# Patient Record
Sex: Female | Born: 1955 | Race: White | Hispanic: No | Marital: Married | Smoking: Never smoker
Health system: Southern US, Community
[De-identification: ages and names within clinical notes are randomized; demographics above are authoritative.]

## PROBLEM LIST (undated history)

## (undated) DIAGNOSIS — R509 Fever, unspecified: Secondary | ICD-10-CM

## (undated) DIAGNOSIS — M341 CR(E)ST syndrome: Principal | ICD-10-CM

## (undated) DIAGNOSIS — I7300 Raynaud's syndrome without gangrene: Secondary | ICD-10-CM

## (undated) DIAGNOSIS — N951 Menopausal and female climacteric states: Secondary | ICD-10-CM

## (undated) DIAGNOSIS — M1990 Unspecified osteoarthritis, unspecified site: Secondary | ICD-10-CM

## (undated) HISTORY — PX: TONSILLECTOMY: SUR1361

## (undated) HISTORY — PX: BREAST CYST EXCISION: SHX579

## (undated) HISTORY — PX: BREAST EXCISIONAL BIOPSY: SUR124

## (undated) HISTORY — PX: KNEE ARTHROSCOPY: SUR90

---

## 2009-06-06 NOTE — Telephone Encounter (Signed)
Medication is correct.  Patient was started on medication 12/11/08 (last seen on 12/10/08).  No future appointment is scheduled with you to be seen.  Did you want patient to continue medication?

## 2009-06-07 NOTE — Telephone Encounter (Signed)
Per Dr. Marshell Levan, patient advised that she is due for follow up appointment.  Appointment scheduled with Dr. Marshell Levan for 06/27/09.

## 2009-06-27 NOTE — Progress Notes (Signed)
Subjective:      Patient ID: Stefanie Marshall is a 53 y.o. female.    HPI Comments: Vitamin D level f/u: Patient nails are still very brittle and so are her teeth. Also any bruises, scratches seem to take a long time to heal. Patient is also having indigestion problems after eating she gets a burning feeling in her chest that goes around to her back. Also if so does not eat Fiber one for breakfast she can not have a BM. Patient has lump under her left arm pit, she has appointment with [facility] outpatient imaging  07/27/09  heartburn 2 x / week  x 1 year , more recently had increase in symptoms including upper dysphagia with breads and meats . Has tried AAOC with mild improvement . NSAID's for 2-3 days , couple times / month for tension headache .      Review of Systems   Constitutional: Negative.    HENT: Negative.    Respiratory: Negative.    Cardiovascular: Negative.    Gastrointestinal: Negative.         See HPI    Genitourinary: Negative.    Musculoskeletal: Negative.    Neurological: Negative.    Psychiatric/Behavioral: Negative.    All other systems reviewed and are negative.        Objective:   Physical Exam   Nursing note and vitals reviewed.  Constitutional: She is oriented to person, place, and time. She appears well-developed and well-nourished. No distress.   HENT:        small chip off the upper incisor    Cardiovascular: Normal rate, regular rhythm, normal heart sounds and intact distal pulses.   No extrasystoles are present. Exam reveals no gallop and no friction rub.    No murmur heard.  Pulses:       Dorsalis pedis pulses are 2+ on the right side, and 2+ on the left side.   Pulmonary/Chest: Effort normal and breath sounds normal. No respiratory distress. She has no decreased breath sounds. She has no wheezes. She has no rales. She exhibits no tenderness.   Abdominal: Soft. Bowel sounds are normal. She exhibits no distension and no mass. There is no organomegaly, splenomegaly or hepatomegaly. No  tenderness. She has no rebound and no guarding.   Musculoskeletal: She exhibits no edema and no tenderness.   Lymphadenopathy:     She has axillary adenopathy.        Left axillary: Pectoral adenopathy present.        single tender 1x 1.5 cm node or cyst, freely mobile    Neurological: She is alert and oriented to person, place, and time.   Skin: She is not diaphoretic.        few vertical cracks in the nails    Psychiatric: She has a normal mood and affect. Her behavior is normal. Judgment and thought content normal.       ASSESSMENT and PLAN ;     1.  Unspecified Vitamin D Deficiency (268.9) labs today  since on replacement   Vitamin D (ERGOCALCIFEROL) 50000 UNIT capsule, VITAMIN D 25 HYDROXY    2.  Other Specified Disease of Nail (703.8) if not better and labs are ok then will refer to Dr. Eddie Dibbles   ZINC    3.  Irritable Bowel Syndrome (564.1) stable       4.  Dysphagia (787.20S) EGD with Dr. June Leap  to evaluate for swallowing problems.  Non-pharm measures including avoidance of caffine, smoking,  NSAIDS, chocolates, acidic foods such as tomato sauces, orange juice , grapefruit juice, and eating within 2 hours of bedtime discussed with the patient.   CBC WITH AUTO DIFFERENTIAL, COMPREHENSIVE METABOLIC PANEL    5.  GERD (Gastroesophageal Reflux Disease) (530.81S)EGD with Dr. June Leap  to evaluate for swallowing problems.  Non-pharm measures including avoidance of caffine, smoking, NSAIDS, chocolates, acidic foods such as tomato sauces, orange juice , grapefruit juice, and eating within 2 hours of bedtime discussed with the patient.   omeprazole 20 mg bid x couple months pending EGD and response to treatment     CBC WITH AUTO DIFFERENTIAL, COMPREHENSIVE METABOLIC PANEL        [name], MD

## 2009-06-27 NOTE — Patient Instructions (Addendum)
EGD with Dr. June Leap  to evaluate for swallowing problems.  Non-pharm measures including avoidance of caffine, smoking, NSAIDS, chocolates, acidic foods such as tomato sauces, orange juice , grapefruit juice, and eating within 2 hours of bedtime discussed with the patient.   Dr. Fortunato Curling

## 2009-06-28 LAB — COMPREHENSIVE METABOLIC PANEL
ALT: 15 U/L (ref 10–40)
AST: 19 U/L (ref 15–37)
Albumin/Globulin Ratio: 1.7 (ref 1.1–2.2)
Albumin: 4.1 g/dL (ref 3.4–5.0)
Alkaline Phosphatase: 40 U/L — ABNORMAL LOW
BUN: 19 mg/dL — ABNORMAL HIGH (ref 7–18)
CO2: 28 meq/L (ref 21–32)
Calcium: 9.7 mg/dL (ref 8.3–10.6)
Chloride: 104 meq/L (ref 99–110)
Creatinine: 0.9 mg/dL (ref 0.6–1.1)
GFR Est, African/Amer: >60
GFR, Estimated: >60 (ref >60)
Glucose: 97 mg/dL (ref 70–99)
Potassium: 4 meq/L (ref 3.5–5.1)
Sodium: 140 meq/L (ref 136–145)
Total Bilirubin: 0.4 mg/dL (ref 0.0–1.0)
Total Protein: 6.4 g/dL (ref 6.4–8.2)

## 2009-06-28 LAB — CBC WITH AUTO DIFFERENTIAL
Basophils %: 0.3 % (ref 0.0–2.0)
Basophils Absolute: 0 10*3 (ref 0.0–0.2)
Eosinophils %: 0.9 % (ref 0.0–5.0)
Eosinophils Absolute: 0.1 10*3 (ref 0.0–0.6)
Granulocyte Absolute Count: 5 10*3 (ref 1.7–7.7)
Hematocrit: 39.6 % (ref 36.0–48.0)
Hemoglobin: 13.5 g/dL (ref 12.0–16.0)
Lymphocytes %: 24.2 % — ABNORMAL LOW (ref 25.0–40.0)
Lymphocytes Absolute: 1.8 10*3 (ref 1.0–5.1)
MCH: 31.6 pg (ref 26–34)
MCHC: 34.1 g/dL (ref 31–36)
MCV: 92.6 fl (ref 80–100)
MPV: 8 fl (ref 5.0–10.5)
Monocytes %: 6.1 % (ref 0.0–8.0)
Monocytes Absolute: 0.4 10*3 (ref 0.0–0.95)
Platelets: 314 10*3 (ref 135–450)
RBC: 4.27 10*6 (ref 4.0–5.2)
RDW: 13.4 % (ref 11.5–14.5)
Segs Relative: 68.5 % — ABNORMAL HIGH (ref 42.0–63.0)
WBC: 7.3 10*3 (ref 4.0–11.0)

## 2009-06-29 LAB — VITAMIN D 25 HYDROXY: Vit D, 25-Hydroxy: 58 ng/mL (ref 30–80)

## 2009-07-01 NOTE — Telephone Encounter (Signed)
Patient would her lab results from 06/27/09

## 2009-07-01 NOTE — Telephone Encounter (Signed)
see lab note

## 2009-12-16 NOTE — Telephone Encounter (Signed)
Patient advised and rx phoned to pharmacy.

## 2009-12-16 NOTE — Telephone Encounter (Signed)
Patient c/o sore throat, laryngitis,sinus drainage, productive cough of yellow and clear phlegm. No fever. Sxs began last Thursday. Taking Tylenol and generic sinus medication.

## 2009-12-16 NOTE — Telephone Encounter (Signed)
Amoxicillin 500 mg tid x 10 days ,  follow up if  symptoms increase or persist.

## 2009-12-21 NOTE — Progress Notes (Signed)
Subjective:      Patient ID: Stefanie Marshall is a 54 y.o. female.    HPI Comments: complains of persistent cough and laryngitis , right ear pain and pressure . Has been on Amoxicillin 500 mg tid x 10 days , no other over the counter       Review of Systems   Respiratory: Positive for cough.        Objective:   Physical Exam   Nursing note and vitals reviewed.  Constitutional: She is oriented to person, place, and time. She appears well-developed and well-nourished. No distress.   HENT:   Head: Normocephalic and atraumatic.   Right Ear: External ear normal. No drainage. Tympanic membrane is injected and bulging. No decreased hearing is noted.   Left Ear: External ear normal. No drainage. Tympanic membrane is injected and bulging. No decreased hearing is noted.   Nose: Mucosal edema and rhinorrhea present. Right sinus exhibits maxillary sinus tenderness. Right sinus exhibits no frontal sinus tenderness. Left sinus exhibits maxillary sinus tenderness. Left sinus exhibits no frontal sinus tenderness.   Mouth/Throat: Oropharynx is clear and moist. No oropharyngeal exudate, posterior oropharyngeal edema, posterior oropharyngeal erythema or tonsillar abscesses.   Eyes: Right eye exhibits no discharge and no exudate. Left eye exhibits no discharge and no exudate. Right conjunctiva is not injected. Right conjunctiva has no hemorrhage. Left conjunctiva is not injected. Left conjunctiva has no hemorrhage.   Neck: Normal range of motion. Neck supple.   Cardiovascular: Normal rate, regular rhythm, normal heart sounds and intact distal pulses.    No murmur heard.  Pulmonary/Chest: Effort normal and breath sounds normal. No respiratory distress. She has no wheezes. She has no rales. She exhibits no tenderness.   Abdominal: No tenderness.   Musculoskeletal: She exhibits no edema.   Lymphadenopathy:        Head (right side): No submental, no submandibular, no tonsillar and no preauricular adenopathy present.        Head (left side): No  submental, no submandibular, no tonsillar and no preauricular adenopathy present.     She has no cervical adenopathy.   Neurological: She is alert and oriented to person, place, and time.   Skin: She is not diaphoretic.      ASSESSMENT and PLAN ;     1.  Acute maxillary sinusitis (461.0) partially treated , complete Amoxicillin 500 mg tid x 10 days and sudafed till the pressure resolves   if the sinus pain persist then change to avelox for 10 days           [name], MD

## 2009-12-21 NOTE — Patient Instructions (Signed)
sudafed till the pressure resolves   if the sinus pain persist then change to avelox for 10 days

## 2010-01-12 NOTE — Progress Notes (Signed)
Subjective:      Patient ID: Stefanie Marshall is a 54 y.o. female.    HPI Comments: follow up -  patient thinks she might have fluid in her ears , rest of the sinusitis has resolved   still with some upper esophageal dysphagia which did improve for awhile , no reflux but has epigastric pressure , has improved the diet   nails still very sensitive , brittle with ridges , no other skin problems , no improvement with the vitamin - D       Review of Systems   All other systems reviewed and are negative.        Objective:   Physical Exam   Nursing note and vitals reviewed.  Constitutional: She appears well-developed and well-nourished. No distress.   HENT:        mild bilateral SOM following recent URI    Abdominal: Soft. Bowel sounds are normal. She exhibits no distension and no mass. No tenderness. She has no rebound and no guarding.   Skin: She is not diaphoretic.        brittle nails with linear ridges        ASSESSMENT and PLAN ;     1.  Unspecified vitamin D deficiency (268.9) will continue with MVI and can stop the vitamin - D supplements     2.  Urge incontinence (788.31)     3.  Other specified disease of nail (703.8) will follow up  with Dr. Rexford Maus     4.  Irritable bowel syndrome (564.1)  Controlled , continue current treatment      5.  GERD (gastroesophageal reflux disease) (530.81S) Non-pharm measures including avoidance of caffine, smoking, NSAIDS, chocolates, acidic foods such as tomato sauces, orange juice , grapefruit juice, and eating within 2 hours of bedtime discussed with the patient. , continue prilosec and follow up  Dr. June Leap if dysphagia progresses     6.  Brittle nails (703.8J) see above         [name], MD

## 2010-07-06 NOTE — Progress Notes (Signed)
Subjective:      Patient ID: Stefanie Marshall is a 54 y.o. female.    HPI Comments: Patient presents with:    Sinus Problem - follow up - patient having problems with neck pain x several weeks on the right , no trauma  and rhinorrhea which is tolerable , nose always feels dry .  Patient having pain in upper right thigh over the trochanter .       Patient Active Problem List    GERD (gastroesophageal reflux disease) - overall controlled ,  dysphagia to breads  but better than before dillitation , has cut back on caffeine          Priority: High (1)         Date Noted: 08/04/2009         Overview Note:            barium swallow 07/28/2009 - small HH with mild            reflux , dilatation 8 /2010      Allergic rhinitis, cause unspecified         Priority: High (1)    Urge incontinence         Priority: Medium (2)    Symptomatic menopausal or female climacteric states         Priority: Medium (2)    Unspecified backache         Priority: Medium (2)    Benign neoplasm of breast         Priority: Medium (2)    Irritable bowel syndrome         Priority: Medium (2)    Preventative health care         Priority: Low (3)         Date Noted: 04/01/2010         Overview Note:            mammogram 03/30/2010 within normal limits             Patient declined tdap a this time.  Will check            with insurance and call if             wants shot      Rosacea         Priority: Low (3)    HEADACHE         Priority: Low (3)      allergies and intolerances    -- Detrol (Tolterodine Tartrate)     --  headache   -- Erythromycin Ethylsuccinate     --  Morning sickness   -- Iodine      Current outpatient prescriptions:omeprazole (PRILOSEC) 20 MG capsule, Take 20 mg by mouth daily.  , Disp: , Rfl: ;  estrogens conjugated, synthetic A, (CENESTIN) 0.625 MG tablet, Take 0.625 mg by mouth daily., Disp: , Rfl: ;  progesterone (PROMETRIUM) 200 MG capsule, Take 200 mg by mouth. First 12 days of month, Disp: , Rfl: ;  Multiple Vitamins-Minerals  (CENTRUM PO), Take  by mouth daily., Disp: , Rfl:      ----------------------------                   07/06/10                          0813           ----------------------------  BP:            100/64           Pulse:           64             Weight:  124 lb (56.246 kg)    ----------------------------        Review of Systems   HENT: Positive for rhinorrhea.    Eyes:        Dry eye    Musculoskeletal: Positive for arthralgias.   All other systems reviewed and are negative.        Objective:   Physical Exam   Nursing note and vitals reviewed.  Constitutional: She is oriented to person, place, and time. She appears well-developed and well-nourished. No distress.   HENT:        allergic rhinitis    Neck: Muscular tenderness present.        spasm and tenderness over the trapezius muscle on the right       Cardiovascular: Normal rate, regular rhythm, normal heart sounds and intact distal pulses.   No extrasystoles are present. Exam reveals no gallop and no friction rub.    No murmur heard.  Pulses:       Dorsalis pedis pulses are 2+ on the right side, and 2+ on the left side.   Pulmonary/Chest: Effort normal and breath sounds normal. No respiratory distress. She has no decreased breath sounds. She has no wheezes. She has no rales. She exhibits no tenderness.   Abdominal: Soft. Bowel sounds are normal. She exhibits no distension and no mass. There is no hepatosplenomegaly, splenomegaly or hepatomegaly. Tenderness is present in the epigastric area. She has no rigidity, no rebound, no guarding and no CVA tenderness.   Musculoskeletal: She exhibits no edema and no tenderness.        tenderness over the lateral right thigh    Neurological: She is alert and oriented to person, place, and time.   Skin: She is not diaphoretic.   Psychiatric: She has a normal mood and affect. Her behavior is normal. Judgment and thought content normal.      ASSESSMENT and PLAN ;     1.  GERD (gastroesophageal reflux disease) - see below      2.  Neck pain - see below     3.  Right thigh pain - strain of the vastus muscle , stretching as demonstrated     4.  Preventative health care         Patient Instructions    Non-pharm measures including avoidance of caffine, smoking, NSAIDS, chocolates, acidic foods such as tomato sauces, orange juice , grapefruit juice, and eating within 2 hours of bedtime discussed with the patient.   increase the omeprazole  if the swallowing increases  ice and stretching for the right thigh as discussed   sleep position and ergonomics as discussed for the neck , massage and ice as needed     if the nasonex is not working then call and we can change to atrovent        No results found for this visit on 07/06/10.     [name], MD    No orders of the defined types were placed in this encounter.

## 2010-07-06 NOTE — Patient Instructions (Addendum)
Non-pharm measures including avoidance of caffine, smoking, NSAIDS, chocolates, acidic foods such as tomato sauces, orange juice , grapefruit juice, and eating within 2 hours of bedtime discussed with the patient.   increase the omeprazole  if the swallowing increases  ice and stretching for the right thigh as discussed   sleep position and ergonomics as discussed for the neck , massage and ice as needed     if the nasonex is not working then call and we can change to atrovent

## 2011-04-24 NOTE — Telephone Encounter (Signed)
Patient jammed her left ring finger into the floor on Monday and says she thinks it may be broken. She did not go to the ER or Urgent Care but would like to know if there are any signs she should watch for. She says it's bruised and very swollen. She says there is slight tingling and numbness. She just wants to know if there are any signs that the blood supply might be cut off etc.

## 2011-04-25 NOTE — Telephone Encounter (Signed)
lmtcb

## 2011-04-25 NOTE — Telephone Encounter (Signed)
Patient scheduled today.

## 2011-04-25 NOTE — Progress Notes (Signed)
Subjective:      Patient ID: Stefanie Marshall is a 55 y.o. female.    HPI Comments: Patient presents with:    Hand Pain - "Jammed"left ring finger on Monday on wood floor-finger swollen, discolored and painful        allergies and intolerances    -- Detrol (Tolterodine Tartrate)     --  headache   -- Erythromycin Ethylsuccinate     --  Morning sickness   -- Iodine     Current outpatient prescriptions:omeprazole (PRILOSEC) 20 MG capsule, TAKE ONE CAPSULE BY MOUTH TWICE A DAY, Disp: 60 capsule, Rfl: 5;  mometasone (NASONEX) 50 MCG/ACT nasal spray, 2 sprays by Nasal route daily., Disp: 1 Inhaler, Rfl: 5;  estrogens conjugated, synthetic A, (CENESTIN) 0.625 MG tablet, Take 0.625 mg by mouth daily., Disp: , Rfl: ;  progesterone (PROMETRIUM) 200 MG capsule, Take 200 mg by mouth. First 12 days of month, Disp: , Rfl:   Multiple Vitamins-Minerals (CENTRUM PO), Take  by mouth daily., Disp: , Rfl:      ----------------------------                   04/25/11                          1233           ----------------------------     BP:            102/62           Pulse:           72             Resp:            20             Weight:  124 lb (56.246 kg)    ----------------------------      Hand Pain         Review of Systems    Objective:   Physical Exam   Musculoskeletal:        mild tenderness and faint bruising without swelling of the left ring finger DIP , full range of motion       ASSESSMENT and PLAN ;     1.  Finger injury mild contusion , ice and elevation as needed , no sign of fracture         There are no Patient Instructions on file for this visit.     No results found for this visit on 04/25/11.     [name], MD    No orders of the defined types were placed in this encounter.

## 2011-04-25 NOTE — Telephone Encounter (Signed)
should be seen , either by me or Dr. Anne Hahn , please take care of this .

## 2011-09-10 NOTE — Telephone Encounter (Signed)
Medication in on patients history list.  Patient last seen 04/25/2011

## 2011-09-11 NOTE — Telephone Encounter (Signed)
Prior Authorization approved for Nasonex, pharmacy advised. PA good through 09/10/2012

## 2011-10-07 NOTE — Progress Notes (Signed)
Subjective:      Patient ID: Stefanie Marshall is a 55 y.o. female.    HPI Comments: Patient presents with:    Hand Pain - Left ring finger dirt under nail  for two weeks.        allergies and intolerances    -- Detrol (Tolterodine Tartrate)     --  headache   -- Erythromycin Ethylsuccinate     --  Morning sickness   -- Iodine     Current outpatient prescriptions:NASONEX 50 MCG/ACT nasal spray, USE 2 SPRAYS IN EACH NOSTRIL DAILY, Disp: 17 g, Rfl: 1;  omeprazole (PRILOSEC) 20 MG capsule, TAKE ONE CAPSULE BY MOUTH TWICE A DAY, Disp: 60 capsule, Rfl: 5;  estrogens conjugated, synthetic A, (CENESTIN) 0.625 MG tablet, Take 0.625 mg by mouth daily., Disp: , Rfl: ;  progesterone (PROMETRIUM) 200 MG capsule, Take 200 mg by mouth. First 12 days of month, Disp: , Rfl:   Multiple Vitamins-Minerals (CENTRUM PO), Take  by mouth daily., Disp: , Rfl:     ----------------------------                09/11/11                       1143         ----------------------------   BP:           122/76         Pulse:          76           Temp:   98.2 F (36.8 C)    TempSrc:       Oral          Resp:           18           Weight: 126 lb (57.153 kg)  ----------------------------     Hand Pain         Review of Systems    Objective:   Physical Exam   Nursing note and vitals reviewed.  Skin:        benign nail other than dirt , no splinter or foreign body       ASSESSMENT and PLAN ;     1.  Finger pain I can't believe someone would come to the office for dirt under their nail        There are no Patient Instructions on file for this visit.     No results found for this visit on 09/11/11.     [name], MD    No orders of the defined types were placed in this encounter.

## 2011-10-29 NOTE — Progress Notes (Signed)
Subjective:      Patient ID: Stefanie Marshall is a 55 y.o. female.    HPI Comments: Patient presents with:    Fever - Patient has had a fever, dry cough, pain in eyebrows, pain in middle back, achy, and this started 9 days ago. She has had trouble sleeping due to the coughing. currently taking mucinex DM AND tylenol         allergies and intolerances    -- Detrol (Tolterodine Tartrate)     --  headache   -- Erythromycin Ethylsuccinate     --  Morning sickness   -- Iodine     Current outpatient prescriptions:Dextromethorphan-Guaifenesin (MUCINEX DM) 30-600 MG TB12, Take  by mouth daily as needed.  , Disp: , Rfl: ;  acetaminophen (TYLENOL) 500 MG tablet, Take 500 mg by mouth every 4 hours as needed.  , Disp: , Rfl: ;  NASONEX 50 MCG/ACT nasal spray, USE 2 SPRAYS IN EACH NOSTRIL DAILY, Disp: 17 g, Rfl: 1;  omeprazole (PRILOSEC) 20 MG capsule, TAKE ONE CAPSULE BY MOUTH TWICE A DAY, Disp: 60 capsule, Rfl: 5  estrogens conjugated, synthetic A, (CENESTIN) 0.625 MG tablet, Take 0.625 mg by mouth daily., Disp: , Rfl: ;  progesterone (PROMETRIUM) 200 MG capsule, Take 200 mg by mouth. First 12 days of month, Disp: , Rfl: ;  Multiple Vitamins-Minerals (CENTRUM PO), Take  by mouth daily., Disp: , Rfl:     ----------------------------                10/29/11                       0942         ----------------------------   BP:           124/72         Pulse:          72           Temp:   99.3 F (37.4 C)    Weight: 126 lb (57.153 kg)  ----------------------------     Fever   Associated symptoms include congestion, coughing, headaches and a sore throat. Pertinent negatives include no wheezing.       Review of Systems   Constitutional: Positive for fever and chills.   HENT: Positive for congestion, sore throat and sinus pressure.    Respiratory: Positive for cough. Negative for shortness of breath and wheezing.    Neurological: Positive for headaches.       Objective:   Physical Exam   [nursing  notereviewed.  Constitutional: She is oriented to person, place, and time. She appears well-developed and well-nourished. No distress.   HENT:   Head: Normocephalic and atraumatic.   Right Ear: External ear normal.   Left Ear: External ear normal.   Nose: Nose normal. Right sinus exhibits no maxillary sinus tenderness and no frontal sinus tenderness. Left sinus exhibits no maxillary sinus tenderness and no frontal sinus tenderness.   Mouth/Throat: Oropharynx is clear and moist. No oropharyngeal exudate.   Eyes: Right eye exhibits no discharge and no exudate. Left eye exhibits no discharge and no exudate. Right conjunctiva is not injected. Right conjunctiva has no hemorrhage. Left conjunctiva is not injected. Left conjunctiva has no hemorrhage.   Neck: Normal range of motion. Neck supple.   Cardiovascular: Normal rate, regular rhythm, normal heart sounds and intact distal pulses.    No murmur heard.  Pulmonary/Chest: Effort normal and breath sounds normal. No respiratory  distress. She has no wheezes. She has no rales. She exhibits no tenderness.   Abdominal: There is no tenderness.   Musculoskeletal: She exhibits no edema.   Lymphadenopathy:     She has no cervical adenopathy.   Neurological: She is alert and oriented to person, place, and time.   Skin: She is not diaphoretic.        ASSESSMENT and PLAN ;     1.  Acute upper respiratory infections of unspecified site - appears viral but with duration need to consider bacterial infection , see below      2.  GERD (gastroesophageal reflux disease) - prilosec daily over the next few days         All above medical conditions have been reviewed and assessed to be stable therefore will continue current treatment with the exception of that stated below .    Patient Instructions    increase fluids to 8-10 12oz / day   tylenol and if no improvement by tomorrow then start the ceftin for sinusitis        No results found for this visit on 10/29/11.     [name], MD    No  orders of the defined types were placed in this encounter.

## 2011-10-29 NOTE — Patient Instructions (Signed)
increase fluids to 8-10 12oz / day   tylenol and if no improvement by tomorrow then start the ceftin for sinusitis

## 2011-10-31 NOTE — Telephone Encounter (Signed)
Per Dr.Nartker patient advised to fill the antibiotic that was given at office visit and to call if not feeling better.

## 2011-10-31 NOTE — Telephone Encounter (Signed)
Patient called stating that she is now going on 12 days of running fevers. Her fever has been staying around the 102 point. She had been taking Tylenol every 4 hours to help keep it down, but she states that this is no longer helping. Patient still has the dry cough and has been taking the 1 teaspoon of the cough syrup given on 10-29-11 but it doesn't seem to be touching the cough. Patient is also still achy and states that she isn't any worse but isn't any better either. She has been trying to drink as much as she can, she is drinking about 8 glasses of fluids a day is all. Patient would like to know what else Dr.Nartker would recommend.    Patient [phone]  [facility] [phone]

## 2011-11-02 ENCOUNTER — Telehealth

## 2011-11-02 MED ORDER — HYDROCOD POLST-CPM POLST ER 10-8 MG/5ML PO SUER
10-8 MG/5ML | Freq: Two times a day (BID) | ORAL | Status: DC | PRN
Start: 2011-11-02 — End: 2011-11-06

## 2011-11-02 MED ORDER — MOXIFLOXACIN HCL 400 MG PO TABS
400 MG | ORAL_TABLET | Freq: Every day | ORAL | Status: AC
Start: 2011-11-02 — End: 2011-11-12

## 2011-11-02 NOTE — Telephone Encounter (Signed)
Still running a fever at 102. Coughing, would like to know if she can get a stronger cough medicine. The cough is not getting any better. Is there anything else she can do to reduce the fever.

## 2011-11-02 NOTE — Telephone Encounter (Signed)
Patient advised and rx faxed to pharmacy

## 2011-11-03 LAB — CBC WITH AUTO DIFFERENTIAL
Basophils %: 0.3 % (ref 0.0–2.0)
Basophils Absolute: 0 10*3 (ref 0.0–0.2)
Eosinophils %: 0.1 % (ref 0.0–5.0)
Eosinophils Absolute: 0 10*3 (ref 0.0–0.6)
Granulocyte Absolute Count: 10 10*3 — ABNORMAL HIGH (ref 1.7–7.7)
Hematocrit: 37.9 % (ref 36.0–48.0)
Hemoglobin: 12.9 gm/dl (ref 12.0–16.0)
Lymphocytes %: 9.9 % — ABNORMAL LOW (ref 25.0–40.0)
Lymphocytes Absolute: 1.2 10*3 (ref 1.0–5.1)
MCH: 31 pg (ref 26–34)
MCHC: 34 gm/dl (ref 31–36)
MCV: 91.1 fl (ref 80–100)
MPV: 7.8 fl (ref 5.0–10.5)
Monocytes %: 3.8 % (ref 0.0–12.0)
Monocytes Absolute: 0.4 10*3 (ref 0.0–1.3)
Platelets: 513 10*3 — ABNORMAL HIGH (ref 135–450)
RBC: 4.16 10*6 (ref 4.0–5.2)
RDW: 13.2 % (ref 11.5–14.5)
Segs Relative: 85.9 % — ABNORMAL HIGH (ref 42.0–63.0)
WBC: 11.7 10*3 — ABNORMAL HIGH (ref 4.0–11.0)

## 2011-11-03 LAB — COMPREHENSIVE METABOLIC PANEL
ALT: 20 U/L (ref 10–40)
AST: 25 U/L (ref 15–37)
Albumin/Globulin Ratio: 1.6 (ref 1.1–2.2)
Albumin: 3.8 gm/dl (ref 3.4–5.0)
Alkaline Phosphatase: 91 U/L (ref 45–129)
BUN: 12 mg/dl (ref 7–18)
CO2: 31 mEq/L (ref 21–32)
Calcium: 9.2 mg/dl (ref 8.3–10.6)
Chloride: 102 mEq/L (ref 99–110)
Creatinine: 0.7 mg/dl (ref 0.6–1.1)
GFR Est, African/Amer: >60
GFR, Estimated: >60 (ref >60)
Glucose: 121 mg/dl — ABNORMAL HIGH (ref 70–99)
Potassium: 4.4 mEq/L (ref 3.5–5.1)
Sodium: 142 mEq/L (ref 136–145)
Total Bilirubin: 0.3 mg/dl (ref 0.0–1.0)
Total Protein: 6.2 gm/dl — ABNORMAL LOW (ref 6.4–8.2)

## 2011-11-05 NOTE — Telephone Encounter (Signed)
WBC up with current right pneumonia

## 2011-11-06 MED ORDER — HYDROCOD POLST-CPM POLST ER 10-8 MG/5ML PO SUER
10-8 MG/5ML | Freq: Two times a day (BID) | ORAL | Status: AC | PRN
Start: 2011-11-06 — End: 2012-11-05

## 2011-11-06 NOTE — Telephone Encounter (Signed)
Rx called to pharmacy with directions

## 2011-11-06 NOTE — Telephone Encounter (Signed)
Patient is requesting a refill for this cough medication after four days.  Pharmacy said this should have lasted for 12 days not 4.  Patient said she is taking it every 12 hours but is taking a tablespoon each time.  Patient told pharmacy if you did not want to refill this early she would need something else in its place.

## 2011-11-07 NOTE — Telephone Encounter (Signed)
ok , mild elevation of WBC from pneumonia

## 2011-11-07 NOTE — Telephone Encounter (Signed)
lmtcb at work for patient.

## 2011-11-07 NOTE — Telephone Encounter (Signed)
complete the antibiotics , should be feeling better by now , back to normal in 2-3 weeks

## 2011-11-07 NOTE — Telephone Encounter (Signed)
Patient advised.   Patient would like to know how much longer she can expect to have this pneumonia.

## 2011-11-07 NOTE — Telephone Encounter (Signed)
Patient requesting results of recent lab work that was done.

## 2011-11-07 NOTE — Telephone Encounter (Signed)
Patient advised

## 2011-11-08 NOTE — Telephone Encounter (Signed)
Patient states she has developed aching of her legs while standing, especially from the knees down.  She spoke with the pharmacist who advised her this could be a side effect of the Avelox.  She is feeling better but the cough remains about the same.    Pharmacy # [phone]

## 2011-11-08 NOTE — Telephone Encounter (Signed)
increase fluids, complete the avelox

## 2011-11-08 NOTE — Telephone Encounter (Signed)
Patient advised.

## 2011-11-09 NOTE — Telephone Encounter (Signed)
Patient called requesting a work note. Patient would like a note stating that she was off work 10/29/11-11/02/11 and she will be working half days 11/05/11-11/09/11 and returning to work full time on 11/12/11.     Please fax the note to [phone] to attention [place]    Patient [phone]

## 2011-11-09 NOTE — Telephone Encounter (Signed)
Ready

## 2011-11-09 NOTE — Telephone Encounter (Signed)
Work note faxed.  Patient advised.

## 2011-12-20 MED ORDER — CEFUROXIME AXETIL 250 MG PO TABS
250 MG | ORAL_TABLET | Freq: Two times a day (BID) | ORAL | Status: AC
Start: 2011-12-20 — End: 2011-12-30

## 2011-12-20 NOTE — Progress Notes (Signed)
Subjective:      Patient ID: Stefanie Marshall is a 56 y.o. female.    HPI Comments: 10/29/2011  Patient presents with:    Fever - Patient has had a fever, dry cough, pain in eyebrows, pain in middle back, achy, and this started 9 days ago. She has had trouble sleeping due to the coughing. currently taking mucinex DM AND tylenol       12/20/2011   Patient presents with:    URI - Patient states runny nose, mild sore throat, sneezing, left  ear ache, and mild shortness of breath due to nasal congestion . Started yesterday, patient has not taken any medication.        allergies and intolerances    -- Detrol (Tolterodine Tartrate)     --  headache   -- Erythromycin Ethylsuccinate     --  Morning sickness   -- Iodine     Current outpatient prescriptions:NASONEX 50 MCG/ACT nasal spray, USE 2 SPRAYS IN EACH NOSTRIL DAILY, Disp: 17 g, Rfl: 1;  omeprazole (PRILOSEC) 20 MG capsule, TAKE ONE CAPSULE BY MOUTH TWICE A DAY, Disp: 60 capsule, Rfl: 5;  estrogens conjugated, synthetic A, (CENESTIN) 0.625 MG tablet, Take 0.625 mg by mouth daily., Disp: , Rfl: ;  progesterone (PROMETRIUM) 200 MG capsule, Take 200 mg by mouth. First 12 days of month, Disp: , Rfl:   Multiple Vitamins-Minerals (CENTRUM PO), Take  by mouth daily., Disp: , Rfl: ;  hydrocodone-chlorpheniramine (TUSSIONEX PENNKINETIC ER) 10-8 MG/5ML LQCR, Take 5 mLs by mouth every 12 hours as needed., Disp: 1 Bottle, Rfl: 1;  Dextromethorphan-Guaifenesin (MUCINEX DM) 30-600 MG TB12, Take  by mouth daily as needed.  , Disp: , Rfl: ;  acetaminophen (TYLENOL) 500 MG tablet, Take 500 mg by mouth every 4 hours as needed.  , Disp: , Rfl:     ----------------------------                12/20/11                       1426         ----------------------------   BP:           118/60         Pulse:          70           Temp:    96.8 F (36 C)     TempSrc:       Oral          Resp:           20           Weight: 121 lb (54.885 kg)  ----------------------------      URI   Associated symptoms include congestion, coughing and a sore throat. Pertinent negatives include no wheezing.   Fever   Associated symptoms include congestion, coughing and a sore throat. Pertinent negatives include no wheezing.       Review of Systems   HENT: Positive for congestion, sore throat, voice change, postnasal drip and sinus pressure.    Respiratory: Positive for cough. Negative for shortness of breath and wheezing.    All other systems reviewed and are negative.        Objective:   Physical Exam   Nursing note and vitals reviewed.  Constitutional: She is oriented to person, place, and time. She appears well-developed and well-nourished. No distress.   HENT:   Head: Normocephalic and  atraumatic.   Right Ear: External ear normal.   Left Ear: External ear normal. Tympanic membrane is erythematous and bulging. A middle ear effusion is present.   Nose: Mucosal edema and rhinorrhea present. Right sinus exhibits no maxillary sinus tenderness and no frontal sinus tenderness. Left sinus exhibits no maxillary sinus tenderness and no frontal sinus tenderness.   Mouth/Throat: Oropharynx is clear and moist. No oropharyngeal exudate.   Eyes: Right eye exhibits no discharge and no exudate. Left eye exhibits no discharge and no exudate. Right conjunctiva is not injected. Right conjunctiva has no hemorrhage. Left conjunctiva is not injected. Left conjunctiva has no hemorrhage.   Neck: Normal range of motion. Neck supple.   Cardiovascular: Normal rate, regular rhythm, normal heart sounds and intact distal pulses.    No murmur heard.  Pulmonary/Chest: Effort normal and breath sounds normal. No respiratory distress. She has no wheezes. She has no rales. She exhibits no tenderness.   Abdominal: There is no tenderness.   Musculoskeletal: She exhibits no edema.   Lymphadenopathy:        Head (right side): No submental, no submandibular, no tonsillar and no preauricular adenopathy present.        Head (left side): No  submental, no submandibular, no tonsillar and no preauricular adenopathy present.     She has no cervical adenopathy.   Neurological: She is alert and oriented to person, place, and time.   Skin: She is not diaphoretic.        ASSESSMENT and PLAN ;     1.  Acute upper respiratory infections of unspecified site - ceftin x 7-10 days , sudafed prn         There are no Patient Instructions on file for this visit.     No results found for this visit on 12/20/11.     [name], MD    No orders of the defined types were placed in this encounter.

## 2012-01-31 MED ORDER — OMEPRAZOLE 20 MG PO CPDR
20 MG | ORAL_CAPSULE | ORAL | Status: DC
Start: 2012-01-31 — End: 2012-07-31

## 2012-01-31 NOTE — Telephone Encounter (Signed)
Last office visit, 12/20/2011

## 2012-07-31 MED ORDER — OMEPRAZOLE 20 MG PO CPDR
20 MG | ORAL_CAPSULE | ORAL | Status: DC
Start: 2012-07-31 — End: 2013-02-04

## 2012-07-31 NOTE — Telephone Encounter (Signed)
Last office visit 12/20/11   Last refill 01/31/12

## 2012-10-03 NOTE — Other (Unsigned)
ED PATIENT ENCOUNTER ARRIVAL: 10/03/12 1903     eMERGENCY dEPARTMENT - UPPER RESPIRATORY INFECTION NOTE     CHIEF COMPLAINT Chief Complaint Patient presents with   Epistaxis         HPI Stefanie Marshall is Marshall 56 year old female who presents with complaints of   symptoms of nose bleed. Symptoms include nose bleed. Onset of symptoms was   several minutes ago, rapidly improving since that time.  Treatment to date:   none. She denies acute injury or fall. Denies use of Coumadin or   anticoagulants. Denies fever or chills.     REVIEW OF SYSTEMS Constitutional:  Denies weight loss or weakness Eyes:    Denies photophobia or discharge or visual disturbance Cardiovascular:  Denies   chest pain, palpitations or swelling GI:  Denies abdominal pain, nausea,   vomiting, or diarrhea Musculoskeletal:  Denies back pain or muscle aches Skin:    Denies rash Neurologic:  Denies slurred speech, focal weakness or sensory   changes Endocrine:  Denies polyuria or polydypsia         PAST MEDICAL HISTORY Past Medical History Diagnosis Date   No Past Medical   History   Esophageal reflux     SURGICAL HISTORY Past Surgical History Procedure Laterality Date   Knee   arthroscopy w/ debridement   right knee   Breast fibroadenoma surgery     Tonsillectomy   Laparoscopy   Refractive surgery   Excise breast lesion Left   2010 left axillary mass-faand breasr skin benign     CURRENT MEDICATIONS Prior to Admission medications Medication Sig Start Date   End Date Taking? Authorizing Provider Unclassified (UNABLE TO FIND) Stefanie Marshall   ANTIBIOTIC    Med, Historical OMEPRAZOLE PO Take 40 mg by mouth 2 (two) times   daily.    Med, Historical mometasone (NASONEX) 50 MCG/ACT SUSP Use 2 sprays in   each nostril as needed. Med, Historical PROGESTERONE MICRONIZED PO Take  by   mouth daily.    Med, Historical Unclassified (UNABLE TO FIND) Take  by mouth   daily. cinestrin ?    Med, Historical     ALLERGIES Allergies Allergen Reactions   Iodine Other (See  Comments)     blemishes   Minocycline GI Upset     FAMILY HISTORY No family history on file.     SOCIAL HISTORY History     Social History   Marital Status: Single   Spouse Name: N/Marshall   Number of   Children: N/Marshall   Years of Education: N/Marshall     Social History Main Topics   Smoking status: Never Smoker   Smokeless   tobacco: None   Alcohol Use: Yes    Comment: rarely   Drug Use: No   Sexually   Active: None     Other Topics Concern   None     Social History Narrative   None     PHYSICAL EXAM VITAL SIGNS: There were no vitals taken for this visit.   Constitutional:  Well developed, well nourished, no acute distress, non-toxic   appearance Eyes: PERRL, conjunctiva normal Nose: Bleeding is controlled from   left nasal turbinate. No evidence of posterior epistaxis. No deformity HENT:   Pharynx clear, no oral exudates, no cervical adenopathy. Respiratory:  Lungs   are clear to auscultation bilaterally. No respiratory distress.   Cardiovascular:  Normal rate, normal rhythm, no murmurs, no gallops, no rubs   Musculoskeletal:  No edema Integument:  Well hydrated, no rash     ED COURSE & MEDICAL DECISION MAKING Patient was evaluated for acute anterior   epitaxies.  She was recently evaluated in the emergency department. During her   visit to the ED she did not experience any nosebleed. However after she was   discharged she started experiencing non traumatic acute anterior epistaxis.   She was advised regarding nasal packing for recurrent nosebleed. However   patient denied the procedure. Instead she requested nose bleed to be managed   by Afrin nasal spray. Therefore Afrin nasal spray was applied on the affected   left nasal turbinate.  Upon reevaluation in 30 minutes, bleeding was   controlled. She was advised to use Afrin nasal spray for 2 days. She is   encouraged to be evaluated by ENT specialist for appropriate management of   acute epistaxis.         Pertinent Labs & Imaging studies reviewed. (See chart for  details)         Patient was given the signs and symptoms of worsening condition and told to   return if they occurred.  Patient understands the plan and management and all   questions were answered.  Patient will follow up with her primary care   physician in the next 2-3 days.  Patient was instructed on Culture results and   preliminary radiology reads and that she would be contacted if Marshall variance or   positive test is obtained.  The nursing note was reviewed, agreed and   appreciated.     Final Diagnosis:     1. Anterior Epistaxis         Patient was prescribe the medication below:     New Prescriptions  No medications on file     Dr. Jorene Marshall provided face to face care, evaluation and management of this   patient. Stefanie Poot, PA                         Stefanie Marshall., [place] 10/03/12 2328         _________________________________  Signed by:    Stefanie Marshall    RA    D: 10/03/2012 11:28 PM  T: 10/03/2012 11:28 PM    This document is confidential medical information.  Unauthorized disclosure or   use of this information is prohibited by law.  If you are not the intended recipient of this document, please advise [place] by   calling immediately [phone].

## 2012-10-03 NOTE — Other (Unsigned)
CURRENT STATUS IS EMERGENCY PENDING CARE MANAGEMENT DESIGNATION.     Any questions regarding MEDICAL RECORDS should be directed to Medical   Records: [facility] [phone] or [facility]   [phone]. Any questions regarding BILLING should be directed to the Care   Management Departments: [facility] [phone] or [facility] [phone].         _________________________________  Signed byJamison Neighbor:    [facility]  NOTIFY    ZZ    D: 10/03/2012 05:46 PM  T:    This document is confidential medical information.  Unauthorized disclosure or   use of this information is prohibited by law.  If you are not the intended recipient of this document, please advise us by   calling immediately [phone].

## 2012-10-03 NOTE — Other (Unsigned)
CURRENT STATUS IS EMERGENCY PENDING CARE MANAGEMENT DESIGNATION.     Any questions regarding MEDICAL RECORDS should be directed to Medical   Records: [facility] [phone] or [facility]   [phone]. Any questions regarding BILLING should be directed to the Care   Management Departments: [facility] [phone] or [facility] [phone].         _________________________________  Signed byJamison Neighbor:    [facility]  NOTIFY    ZZ    D: 10/03/2012 07:03 PM  T:    This document is confidential medical information.  Unauthorized disclosure or   use of this information is prohibited by law.  If you are not the intended recipient of this document, please advise us by   calling immediately [phone].

## 2012-10-04 NOTE — Other (Unsigned)
Patient was seen by myself and examined and case was discussed and examined   with the mid-level provider and I agree with assessment and plan.     Sophronia SimasSeaman, Ronald G., MD 10/04/12 0034         _________________________________  Signed by:    Sophronia SimasONALD G. SEAMAN    10    D: 10/04/2012 12:34 AM  T: 10/04/2012 12:34 AM    This document is confidential medical information.  Unauthorized disclosure or   use of this information is prohibited by law.  If you are not the intended recipient of this document, please advise us by   calling immediately [phone].

## 2012-10-04 NOTE — Other (Unsigned)
ED PATIENT ENCOUNTER ARRIVAL: 10/03/12 1746     EMERGENCY DEPARTMENT - General NOTE     CHIEF COMPLAINT Chief Complaint Patient presents with   Epistaxis     HPI Stefanie Marshall is a 56 year old female who presents with sudden acute   episode of epistaxis from left nostril. Patient complains that bleeding   persisted for approximately 1 hour but has slowly subsided. She denies acute   head or facial injury. Denies use of blood thinner. Reports about taking   Tylenol today for outpatient skin procedure. Patient reports a skin procedure   was performed by plastic surgeon. Denies use of recent anesthesia. Her tetanus   is up-to-date.     REVIEW OF SYSTEMS See HPI for further details. Review of systems otherwise   negative.     PAST MEDICAL HISTORY Past Medical History Diagnosis Date   No Past Medical   History   Esophageal reflux     SURGICAL HISTORY Past Surgical History Procedure Laterality Date   Knee   arthroscopy w/ debridement   right knee   Breast fibroadenoma surgery     Tonsillectomy   Laparoscopy   Refractive surgery   Excise breast lesion Left   2010 left axillary mass-faand breasr skin benign     CURRENT MEDICATIONS Prior to Admission medications Medication Sig Start Date   End Date Taking? Authorizing Provider OMEPRAZOLE PO Take 40 mg by mouth 2   (two) times daily.    Med, Historical mometasone (NASONEX) 50 MCG/ACT SUSP Use   2 sprays in each nostril as needed. Med, Historical PROGESTERONE MICRONIZED   PO Take  by mouth daily.    Med, Historical Unclassified (UNABLE TO FIND) Take    by mouth daily. cinestrin ?    Med, Historical     ALLERGIES Allergies Allergen Reactions   Iodine Other (See Comments)     blemishes   Minocycline GI Upset     FAMILY HISTORY No family history on file.     SOCIAL HISTORY History     Social History   Marital Status: Single   Spouse Name: N/A   Number of   Children: N/A   Years of Education: N/A     Social History Main Topics   Smoking status: Never Smoker   Smokeless    tobacco: Not on file   Alcohol Use: Yes    Comment: rarely   Drug Use: No     Sexually Active: Not on file     Other Topics Concern   Not on file     Social History Narrative   No narrative on file         PHYSICAL EXAM VITAL SIGNS: BP 150/79   Pulse 77   Temp(Src) 97  F (36.1  C)   (Oral)   Resp 14   SpO2 98% Constitutional:  Well developed, Well nourished,   No acute distress, Non-toxic appearance. Nose: Anterior epistaxis was   controlled. No evidence of acute bleed. No evidence of posterior epistaxis.   Facial exam: Dressing and bandage is in place on face and chin. HENT:    Bilateral external ears normal, Oropharynx moist, No oral exudates, Nose   normal. Neck- Normal range of motion, No tenderness, Supple, No stridor. Eyes:    PERRL, Conjunctiva normal, No discharge. Respiratory:  Normal breath sounds,   No respiratory distress, No wheezing, No chest tenderness. Cardiovascular:    Normal heart rate, Normal rhythm, No murmurs, No rubs, No gallops.  GI:  Bowel   sounds normal, Soft, No tenderness, No masses, No pulsatile masses.   Musculoskeletal: Good range of motion in all major joints. Integument:  Warm,   Dry, No erythema, No rash. Neurologic:  Alert & oriented x 3, Normal motor   function, Normal sensory function, No focal deficits noted. Psychiatric:    Affect normal, Judgment normal, Mood normal.     ED COURSE & MEDICAL DECISION MAKING Patient was evaluated for acute onset of   anterior epistaxis. Upon evaluation this episode of anterior epistaxis was   controlled. Nasal turbinates were thoroughly cleansed and irrigated.     Plan was to apply Afrin nasal spray on the affected nostril, but repeated   evaluation revealed no evidence of epistaxis. Therefore medication was not   administered. She is encouraged to use nasal Afrin spray for the next 3 days.   Patient is advised to be evaluated by an ENT specialist for followup.     Pertinent Labs & Imaging studies reviewed. (See chart for details)     The  patient was given the following medication in the Emergency department:   Medication Administration from 10/03/2012 1746 to 10/03/2012 1850    Date/Time   Order Dose Route Action Action by Comments   10/03/2012 1758 oxymetazoline   (AFRIN) 0.05 % nasal spray 2 spray 2 spray Each Nare Given Mingo Amber         The patient now feels improved.     Patient was given the signs and symptoms of worsening condition and told to   return if they occurred.  Patient understands the plan and management and all   questions were answered.  Patient will follow up with her primary care   physician in the next 2-3 days.  Patient was instructed on Culture results and   preliminary radiology reads and that she would be contacted if a variance or   positive test is obtained.  The nursing note was reviewed, agreed and   appreciated.     Final Diagnosis: 1. Acute anterior epistaxis     Dr. Ria Comment provided face to face care, evaluation and management of this   patient. Ivin Poot, PA                 [name], [place] 10/04/12 1700         _________________________________  Signed by:    Ivin Poot    RA    D: 10/04/2012 05:00 PM  T: 10/04/2012 05:00 PM    This document is confidential medical information.  Unauthorized disclosure or   use of this information is prohibited by law.  If you are not the intended recipient of this document, please advise [place] by   calling immediately [phone].

## 2012-10-05 NOTE — Other (Unsigned)
Patient was very carefully explained the various treatment options including   #1 use of a nasal vasoconstrictors agent for the next 12 hours #2 cautery and   #3 Balloon tamponade packing. At this time the patient chooses to use   vasoconstrictive agents.     I have independently examined and evaluated Jasa A Colden. Based on the   history, physical, and all diagnostic studies I agree with the diagnosis and   disposition of this patient. Decisions were made by me, in conjunction with   the mid-level provider. Patient was informed of the findings, diagnosis, and   plan for treatment both by me and the mid-level provider. Patient/family   seemed to understand, voiced understanding, and agreed with the plan. The need   for close follow-up was stressed as well as the need to return immediately if   any new or worsening symptoms.     Christopher L. Ria CommentHuerta, MD 12:35 AM 10/05/2012     Dot BeenHuerta, Christopher L., MD 10/05/12 0036         _________________________________  Signed by:    Alden BenjaminHRISTOPHER L. HUERTA    HU    D: 10/05/2012 12:36 AM  T: 10/05/2012 12:36 AM    This document is confidential medical information.  Unauthorized disclosure or   use of this information is prohibited by law.  If you are not the intended recipient of this document, please advise us by   calling immediately [phone].

## 2012-10-06 NOTE — Telephone Encounter (Signed)
Lm for patient to call back.

## 2012-10-06 NOTE — Telephone Encounter (Signed)
Patient states she was seen by the Urgent Care on Friday for an extreme nose bleed. They sent her home with Afrin nasal spray to use for three days. She states since then she has not had another nose bleed and she feels fine. 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       Wana [phone]

## 2012-10-06 NOTE — Telephone Encounter (Signed)
Can use nasal saline and 2 puffs of afrin prior to flight

## 2012-10-07 NOTE — Telephone Encounter (Signed)
Patient advised.

## 2012-12-12 MED ORDER — CEFUROXIME AXETIL 250 MG PO TABS
250 MG | ORAL_TABLET | Freq: Two times a day (BID) | ORAL | Status: AC
Start: 2012-12-12 — End: 2012-12-22

## 2012-12-12 NOTE — Progress Notes (Signed)
Subjective:      Patient ID: Stefanie Marshall is a 57 y.o. female.    HPI Comments: Patient presents with:    URI - onset 1 month.  cough, sore throat, sinus congestion, sneezing,rhinorrhea for the past week , low grade fever this week , mild shortness of breath , no significant chest congestion . Patient used OTC claritin, benadryl, mucinex and robitussin.        allergies and intolerances    -- Detrol (Tolterodine Tartrate)     --  headache   -- Erythromycin Ethylsuccinate     --  Morning sickness   -- Avelox (Moxifloxacin Hcl In Nacl)     --  myalgias   -- Iodine     Current outpatient prescriptions:omeprazole (PRILOSEC) 20 MG capsule, TAKE ONE CAPSULE BY MOUTH TWICE A DAY, Disp: 60 capsule, Rfl: 5;  Dextromethorphan-Guaifenesin (MUCINEX DM) 30-600 MG TB12, Take  by mouth daily as needed.  , Disp: , Rfl: ;  acetaminophen (TYLENOL) 500 MG tablet, Take 500 mg by mouth every 4 hours as needed.  , Disp: , Rfl: ;  NASONEX 50 MCG/ACT nasal spray, USE 2 SPRAYS IN EACH NOSTRIL DAILY, Disp: 17 g, Rfl: 1  estrogens conjugated, synthetic A, (CENESTIN) 0.625 MG tablet, Take 0.625 mg by mouth daily., Disp: , Rfl: ;  progesterone (PROMETRIUM) 200 MG capsule, Take 200 mg by mouth. First 12 days of month, Disp: , Rfl: ;  Multiple Vitamins-Minerals (CENTRUM PO), Take  by mouth daily., Disp: , Rfl:      ----------------------------                   12/12/12                          1251           ----------------------------     BP:            92/58            Pulse:           76             Temp:     98 ??F (36.7 ??C)       TempSrc:        Oral            Resp:            16             Weight:  130 lb (58.968 kg)    ----------------------------      URI         Review of Systems    Objective:   Physical Exam   Nursing note and vitals reviewed.  Constitutional: She is oriented to person, place, and time. She appears well-developed and well-nourished. No distress.   HENT:   Head: Normocephalic and atraumatic.   Right Ear: External ear  normal.   Left Ear: External ear normal.   Nose: Right sinus exhibits maxillary sinus tenderness. Right sinus exhibits no frontal sinus tenderness. Left sinus exhibits maxillary sinus tenderness. Left sinus exhibits no frontal sinus tenderness.   Mouth/Throat: Oropharynx is clear and moist. No oropharyngeal exudate.   Eyes: Right eye exhibits no discharge and no exudate. Left eye exhibits no discharge and no exudate. Right conjunctiva is not injected. Right conjunctiva has no hemorrhage. Left conjunctiva is not injected. Left conjunctiva has no hemorrhage.   Neck: Normal range  of motion. Neck supple.   Cardiovascular: Normal rate, regular rhythm, normal heart sounds and intact distal pulses.    No murmur heard.  Pulmonary/Chest: Effort normal and breath sounds normal. No respiratory distress. She has no wheezes. She has no rales. She exhibits no tenderness.   Abdominal: There is no tenderness.   Musculoskeletal: She exhibits no edema.   Lymphadenopathy:     She has no cervical adenopathy.   Neurological: She is alert and oriented to person, place, and time.   Skin: She is not diaphoretic.        ASSESSMENT and PLAN ;     1.  Acute maxillary sinusitis - sudafed prn   cefUROXime (CEFTIN) 250 MG tablet        All above medical conditions have been reviewed and assessed to be stable therefore will continue current treatment with the exception of that stated below .    There are no Patient Instructions on file for this visit.     No results found for this visit on 12/12/12.     [name], MD    No orders of the defined types were placed in this encounter.

## 2013-02-04 MED ORDER — OMEPRAZOLE 20 MG PO CPDR
20 MG | ORAL_CAPSULE | ORAL | Status: DC
Start: 2013-02-04 — End: 2013-07-30

## 2013-05-26 MED ORDER — SCOPOLAMINE 1 MG/3DAYS TD PT72
1 MG/3DAYS | MEDICATED_PATCH | TRANSDERMAL | Status: DC
Start: 2013-05-26 — End: 2013-09-01

## 2013-05-26 NOTE — Telephone Encounter (Signed)
Patient called requesting a sea sickness patch for a cruise in a few weeks. [facility] is pharmacy of choice.

## 2013-05-26 NOTE — Telephone Encounter (Signed)
Sent!

## 2013-05-26 NOTE — Telephone Encounter (Signed)
Lm for patient to call back.

## 2013-05-27 NOTE — Telephone Encounter (Signed)
Patient advised.

## 2013-07-30 MED ORDER — OMEPRAZOLE 20 MG PO CPDR
20 MG | ORAL_CAPSULE | ORAL | Status: DC
Start: 2013-07-30 — End: 2013-09-01

## 2013-09-01 MED ORDER — AMOXICILLIN-POT CLAVULANATE 875-125 MG PO TABS
875-125 MG | ORAL_TABLET | Freq: Two times a day (BID) | ORAL | Status: AC
Start: 2013-09-01 — End: 2013-09-11

## 2013-09-01 NOTE — Progress Notes (Signed)
Subjective:      Patient ID: Stefanie Marshall is a 57 y.o. female.    HPI Comments: 09/01/2013    Patient presents with:  Animal Bite: Patient was bitten by a cat yesterday on the right index finger. The finger is swollen, red, painful, and warm to the touch. Patient was put on keflex 5 days ago for minor surgery and feels this may be helping the bite. She also has been taking Norco 5/325 mg as needed for pain.        allergies and intolerances    -- Detrol (Tolterodine Tartrate)     --  headache   -- Erythromycin Ethylsuccinate     --  nausea   -- Avelox (Moxifloxacin Hcl In Nacl)     --  myalgias   -- Iodine     Current outpatient prescriptions:omeprazole (PRILOSEC) 20 MG capsule, Take 40 mg by mouth 2 times daily., Disp: , Rfl: , ;  estradiol (ESTRACE) 2 MG tablet, Take 2 mg by mouth daily., Disp: , Rfl: , ;  cephALEXin (KEFLEX) 500 MG capsule, Take 1 capsule by mouth 3 times daily., Disp: , Rfl: 0, ;  HYDROcodone-acetaminophen (NORCO) 5-325 MG per tablet, Take 1 tablet by mouth every 4 hours as needed for Pain., Disp: , Rfl: ,   Dextromethorphan-Guaifenesin (MUCINEX DM) 30-600 MG TB12, Take  by mouth daily as needed.  , Disp: , Rfl: , ;  acetaminophen (TYLENOL) 500 MG tablet, Take 500 mg by mouth every 4 hours as needed.  , Disp: , Rfl: , ;  NASONEX 50 MCG/ACT nasal spray, USE 2 SPRAYS IN EACH NOSTRIL DAILY, Disp: 17 g, Rfl: 1, ;  progesterone (PROMETRIUM) 200 MG capsule, Take 200 mg by mouth. First 12 days of month, Disp: , Rfl: ,   All medications including over the counter reviewed and updated      ---------------------------                  09/01/13                         1049           ---------------------------     BP:           110/72           Pulse:          70             Temp:    98.5 ??F (36.9 ??C)     TempSrc:       Oral            Resp:           16             Weight:  125 lb (56.7 kg)     ---------------------------     Body mass index is 24.41 kg/(m^2).     Animal Bite         Review of  Systems    Objective:   Physical Exam   Skin:   Right distal index finger with swelling and erythema , no drainage       ASSESSMENT and PLAN ;     1.  Cat bite involving extremity - see below    amoxicillin-clavulanate (AUGMENTIN) 875-125 MG per tablet      Tdap vaccine greater than or equal to 7yo IM    2.  Need for diphtheria-tetanus-pertussis (Tdap) vaccine,  adult/adolescent   Tdap vaccine greater than or equal to 7yo IM          All above medical conditions have been reviewed and assessed to be stable therefore will continue current treatment with the exception of that stated below .    New Prescriptions      AMOXICILLIN-CLAVULANATE (AUGMENTIN) 875-125 MG PER TABLET     Take 1 tablet by mouth 2 times daily for 10 days.        There are no Patient Instructions on file for this visit.     No Follow-up on file.    No results found for this visit on 09/01/13.     [name], MD    Orders Placed This Encounter    Procedures    ???  Tdap vaccine greater than or equal to 7yo IM

## 2013-11-19 HISTORY — PX: BREAST CYST EXCISION: SHX579

## 2014-01-01 LAB — COMPREHENSIVE METABOLIC PANEL
ALT: 11 U/L (ref 10–40)
AST: 17 U/L (ref 15–37)
Albumin/Globulin Ratio: 1.9 (ref 1.1–2.2)
Albumin: 4.2 g/dL (ref 3.4–5.0)
Alkaline Phosphatase: 44 U/L (ref 40–129)
Anion Gap: 17 — ABNORMAL HIGH (ref 3–16)
BUN: 13 mg/dL (ref 7–20)
CO2: 25 mmol/L (ref 21–32)
Calcium: 9.7 mg/dL (ref 8.3–10.6)
Chloride: 104 mmol/L (ref 99–110)
Creatinine: 0.8 mg/dL (ref 0.6–1.1)
GFR African American: >60 (ref >60)
GFR Non-African American: >60 (ref >60)
Globulin: 2.2 g/dL
Glucose: 88 mg/dL (ref 70–99)
Potassium: 4.6 mmol/L (ref 3.5–5.1)
Sodium: 146 mmol/L — ABNORMAL HIGH (ref 136–145)
Total Bilirubin: 0.4 mg/dL (ref 0.0–1.0)
Total Protein: 6.4 g/dL (ref 6.4–8.2)

## 2014-01-01 LAB — CBC
Hematocrit: 40.9 % (ref 36.0–48.0)
Hemoglobin: 13.8 g/dL (ref 12.0–16.0)
MCH: 31.5 pg (ref 26.0–34.0)
MCHC: 33.6 g/dL (ref 31.0–36.0)
MCV: 93.6 fL (ref 80.0–100.0)
MPV: 9.1 fL (ref 5.0–10.5)
Platelets: 345 10*3/uL (ref 135–450)
RBC: 4.37 M/uL (ref 4.00–5.20)
RDW: 13.1 % (ref 12.4–15.4)
WBC: 9.6 10*3/uL (ref 4.0–11.0)

## 2014-01-01 LAB — RHEUMATOID FACTOR: Rheumatoid Factor: 14 IU/mL — ABNORMAL HIGH (ref <14)

## 2014-01-01 LAB — SEDIMENTATION RATE: Sed Rate: 6 mm/Hr (ref 0–30)

## 2014-01-01 NOTE — Progress Notes (Signed)
Subjective:      Patient ID: Stefanie Marshall is a 58 y.o. female.    HPI Comments: 01/01/2014    Patient presents with:  Hand Numbness: cold and numbness and turn very white in hands and feet beginning this summer but much more prominent this winter , mostly with cold exposure , no systemic symptoms         allergies and intolerances    -- Detrol (Tolterodine Tartrate)     --  headache   -- Erythromycin Ethylsuccinate     --  nausea   -- Avelox (Moxifloxacin Hcl In Nacl)     --  myalgias   -- Iodine     Current outpatient prescriptions:omeprazole (PRILOSEC) 20 MG capsule, Take 40 mg by mouth 2 times daily., Disp: , Rfl: , ;  estradiol (ESTRACE) 2 MG tablet, Take 2 mg by mouth daily., Disp: , Rfl: , ;  Dextromethorphan-Guaifenesin (MUCINEX DM) 30-600 MG TB12, Take  by mouth daily as needed.  , Disp: , Rfl: , ;  acetaminophen (TYLENOL) 500 MG tablet, Take 500 mg by mouth every 4 hours as needed.  , Disp: , Rfl: ,   NASONEX 50 MCG/ACT nasal spray, USE 2 SPRAYS IN EACH NOSTRIL DAILY, Disp: 17 g, Rfl: 1, ;  progesterone (PROMETRIUM) 200 MG capsule, Take 200 mg by mouth. First 12 days of month, Disp: , Rfl: ,   All medications including over the counter reviewed and updated      --------------------------                  01/01/14                        0926          --------------------------     BP:           112/60          Pulse:          56            Resp:           16            Weight:  125 lb (56.7 kg)    --------------------------     Body mass index is 24.41 kg/(m^2).       Review of Systems    Objective:   Physical Exam   Constitutional: She is oriented to person, place, and time. She appears well-developed and well-nourished. No distress.   Cardiovascular: Normal rate, regular rhythm, normal heart sounds and intact distal pulses.   No extrasystoles are present. Exam reveals no gallop and no friction rub.    No murmur heard.  Pulses:       Dorsalis pedis pulses are 2+ on the right side, and 2+ on the left side.    Pulmonary/Chest: Effort normal and breath sounds normal. No respiratory distress. She has no decreased breath sounds. She has no wheezes. She has no rales. She exhibits no tenderness.   Abdominal: Soft. Bowel sounds are normal. She exhibits no distension and no mass. There is no hepatosplenomegaly, splenomegaly or hepatomegaly. There is no tenderness. There is no rebound and no guarding.   Musculoskeletal: She exhibits no edema or tenderness.   Neurological: She is alert and oriented to person, place, and time.   Skin: She is not diaphoretic.   Psychiatric: She has a normal mood and affect. Her behavior is normal. Judgment and thought  content normal.   Nursing note and vitals reviewed.     ASSESSMENT and PLAN ;     1.  Raynaud's phenomenon - printed info from uptodate given   Comprehensive Metabolic Panel      CBC      Sedimentation rate, automated      Rheumatoid Factor      ANA    2.  Family history of diabetes mellitus (DM) - labs today    Hemoglobin A1C          All above medical conditions have been reviewed and assessed to be stable therefore will continue current treatment with the exception of that stated below .    New Prescriptions      No medications on file        There are no Patient Instructions on file for this visit.     No Follow-up on file.    No results found for this visit on 01/01/14.     [name], MD    Orders Placed This Encounter    Procedures    ???  Hemoglobin A1C    ???  Comprehensive Metabolic Panel    ???  CBC    ???  Sedimentation rate, automated    ???  Rheumatoid Factor    ???  ANA

## 2014-01-02 LAB — HEMOGLOBIN A1C
Hemoglobin A1C: 5.4 %
eAG: 108.3 mg/dL

## 2014-01-04 LAB — ANA
ANA Titer: 1:1280 {titer}
ANA: POSITIVE — AB

## 2014-01-05 ENCOUNTER — Encounter

## 2014-01-14 ENCOUNTER — Encounter

## 2014-01-14 MED ORDER — AMLODIPINE BESYLATE 2.5 MG PO TABS
2.5 MG | ORAL_TABLET | ORAL | Status: DC
Start: 2014-01-14 — End: 2015-01-27

## 2014-01-14 NOTE — Progress Notes (Signed)
[facility]                                                                                                                      Norm Saltara J Jaikob Borgwardt, MD                                                             [phone](P) [phone] (F)    Primary provider: Anda Kraftavid Nartker, MD  Patient identification: Stefanie Marshall,DOB: 02/19/1956,Sex: female     REASON FOR CONSULTATION:  Rheumatology consultation for evaluation of positive ANA and Raynaud's phenomenon.    HISTORY OF PRESENT ILLNESS:   The patient is a [age] y.o. female stated that she started experiencing tingling and numbness of her fingers and toes associated with typical triphasic raynauds phenomena and especially in her fingers b/l- scattered in different fingers at different times since August 2014.  She has been experiencing worsening of her symptoms-episodes are getting more frequent, lasting long, associated with more paresthesias over last 6 months, get better with warming her fingers. She states that her fingers and toes always remained cold.  She denies any ulcers, scars or blisters.  She has long-standing history of GERD, is taking omeprazole.  She denies any skin thickening or rashes.    Pertinent ROS: Denies weight loss, objective fevers, skin rashes or skin thickening, photosensitivity, focal alopecia, recurrent ocular congestion, dry eyes or mouth, or mucosal ulcers, tinnitus or recent hearing loss. Denies chest pain, palpitations, cough, pleurisy, dysphagia, or features of inflammatory bowel diseases.  No h/o blood clots or bleeding disorders. No renal or genitourinary problems. No focal weakness or persistent paresthesia.  All other ROS are negative.    No past medical history on file.   Past Surgical History   Procedure Laterality Date   ??? Upper gastrointestinal endoscopy  07/15/09     Distal esophagus was dilated to 15mm- schedule for barium swallow   ??? Upper  gastrointestinal endoscopy  08-21-09     esophagus was pneumatically dilated Dr. June LeapLoewenstine   ??? Colonoscopy  01-23-11     Dr. June LeapLoewenstine sigmoid diverticulosis, benign poly recheckin 10 years   ??? Upper gastrointestinal endoscopy  03/03/2012      Dr. June LeapLoewenstine  - minimal reflux      History   Substance Use Topics   ??? Smoking status: Never Smoker    ??? Smokeless tobacco: Never Used   ??? Alcohol Use: Yes      Comment: Socially     Family History   Problem Relation Age of Onset   ??? Diabetes Brother 3061     Medications:  Current Outpatient Prescriptions   Medication Sig Dispense Refill   ??? amLODIPine (NORVASC) 2.5 MG tablet Take 1 tab po daily  90 tablet  1   ??? omeprazole (PRILOSEC) 20 MG capsule Take 40 mg by mouth 2 times daily.       ??? estradiol (ESTRACE) 2 MG tablet Take 2 mg by mouth daily.       ??? Dextromethorphan-Guaifenesin (MUCINEX DM) 30-600 MG TB12 Take  by mouth daily as needed.         ??? acetaminophen (TYLENOL) 500 MG tablet Take 500 mg by mouth every 4 hours as needed.         ??? NASONEX 50 MCG/ACT nasal spray USE 2 SPRAYS IN EACH NOSTRIL DAILY  17 g  1   ??? progesterone (PROMETRIUM) 200 MG capsule Take 200 mg by mouth. First 12 days of month         No current facility-administered medications for this visit.     Allergies:    Detrol; Erythromycin ethylsuccinate; Avelox; and Iodine    PHYSICAL EXAM:    Vitals:    BP 104/62    Pulse 60    Temp(Src) 98.1 ??F (36.7 ??C) (Oral)    Ht 5' (1.524 m)    Wt 125 lb (56.7 kg)    BMI 24.41 kg/m2     General appearance: alert, appears stated age and cooperative.   MKS: Normal gait. Left index finger   DIP bony swelling. Nail fold capillary dilations in all finger nail beds. Normal finger pulps. No finger to toe scares. Normal radial arteries b/l. Toes- no scars, normal pulp. Dusky blue coloration of fingers and toes. Normal dorsalis ped and post tibial pulses. Normal skin texture. No teleangiectases.  Shoulders, elbows, wrists, finger joints b/l, hips, knees, ankles, feet:    There is no evidence of any swelling, synovitis, warmth, tenderness or limitation of motion of any of the patient's upper or lower peripheral joints.  - Strength is  5/5   in both upper and lower extremities.  Skin: No rashes, no induration or skin thickening or nodules. No evidence ischemia or deformities noted in digits or nails.  HEENT: Normal lids, lacrimal glands and pupils. No oral or nasal ulcers. Salivary glands reveal no evidence of abnormality. External inspection of the ears and nose within normal limits.  Neck: No masses or asymmetry. No thyroid enlargement.  Chest: Normal effort, clear to auscultation.  Heart:  Normal s1/s2, no leg edema.  Abdomen: soft, non-tender.    Lymph nodes: No enlargement in cervical, supraclavicular regions.  Neurologic: normal deep tendon reflexes. No foot or wrist drop.          DATA:   Lab Results   Component Value Date    WBC 9.6 01/01/2014    HGB 13.8 01/01/2014    HCT 40.9 01/01/2014    MCV 93.6 01/01/2014    PLT 345 01/01/2014         Chemistry        Component Value Date/Time    NA 146* 01/01/2014 1023    K 4.6 01/01/2014 1023    CL 104 01/01/2014 1023    CO2 25 01/01/2014 1023    BUN 13 01/01/2014 1023    CREATININE 0.8 01/01/2014 1023        Component Value Date/Time    CALCIUM 9.7 01/01/2014 1023    ALKPHOS 44 01/01/2014 1023    AST 17 01/01/2014 1023    ALT 11 01/01/2014 1023    BILITOT 0.4 01/01/2014 1023          Lab Results   Component Value Date    SEDRATE 6  01/01/2014     Lab Results   Component Value Date    ANA POSITIVE* 01/01/2014   11-1278 Centromere pattern  Rf 14  No results found for this basename: CKTOTAL     No results found for this basename: TSH     Lab Results   Component Value Date    VITD25 58 06/27/2009         Radiology Review:    Ba swallow- GERD, no dysmotility seen.    Assessment:   1. Raynaud's disease    2. 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    Plan:  -Discussed about diagnosis, prognosis and management options.    -Diagnostically-obtain other sub-serologies-ENA, double-stranded DNA, SCL 70, cryoglobulins, antiphospholipid antibodies, urinalysis, hepatitis serologies, baseline pulmonary function test and echocardiogram to look for estimated pulmonary artery pressure.    -Therapeutically-  1.  Raynaud's phenomena-discussed in detail about protective measures to prevent tissue loss.  Recommend initiating a small dose of amlodipine 2.5 mg daily, recommend taking blood pressure periodically.  If she is dizzy, she is to call me.    2.  GERD recommend increasing omeprazole twice a day.  -I also gave her reading information on CREST.  She was asked to monitor for any skin changes or fingertip ulcerations or blisters, in that case will optimize treatment.  Reassessment in 3 weeks to go over test results.  Patient indicates understanding and agrees with the management plan.  I reviewed patients medical information and medical history in the medical records.    I thank you for giving me the opportunity to be involved in Stefanie Marshall's care and I look forward following Jeraldean along with you. If you have any questions or concerns please feel free to contact me at any time.    Norm Salt, MD  [facility]  [address]  [address]  [place], [place] [address]  Phone - [phone]  Fax- [phone]    NOTE: This report was transcribed using voice recognition software. Every effort was made to ensure accuracy; however, inadvertent computerized transcription errors may be present.

## 2014-01-15 MED ORDER — OMEPRAZOLE 20 MG PO CPDR
20 MG | ORAL_CAPSULE | ORAL | Status: DC
Start: 2014-01-15 — End: 2014-07-30

## 2014-01-25 LAB — URINALYSIS
Bilirubin Urine: NEGATIVE
Blood, Urine: NEGATIVE
Glucose, Ur: NEGATIVE mg/dL
Ketones, Urine: NEGATIVE mg/dL
Leukocyte Esterase, Urine: NEGATIVE
Nitrite, Urine: NEGATIVE
Protein, UA: NEGATIVE mg/dL
Specific Gravity, UA: 1.025 (ref 1.005–1.030)
Urobilinogen, Urine: 0.2 E.U./dL (ref <2.0)
pH, UA: 6 (ref 5.0–8.0)

## 2014-01-25 LAB — HEPATITIS C ANTIBODY: Hep C Ab Interp: NONREACTIVE

## 2014-01-25 LAB — HEPATITIS B SURFACE ANTIGEN: Hep B S Ag Interp: NONREACTIVE

## 2014-01-25 LAB — C4 COMPLEMENT: C4 Complement: 23.3 mg/dL (ref 10.0–40.0)

## 2014-01-25 LAB — C3 COMPLEMENT: C3 Complement: 98 mg/dL (ref 90.0–180.0)

## 2014-01-25 NOTE — Procedures (Signed)
PATIENT NAME:                   PA #:             MR #[name]:              Marshall, Stefanie                   [id number] [phone]        [id number]         ATTENDING PHYSICIAN:                     VISIT DATE:  DIS DATE:       August AlbinoARA JOSHI ADHIKARI, MD                  01/25/2014                   PRIMARY CARE PHYSICIAN:                  REFERRING PHYSICIAN:          Kori Goins Lorel MonacoJ NARTKER, MD                                                    DATE OF BIRTH:     AGE:            PATIENT TYPE:      RM #:           02-03-1956         57              OPJ                                   Flow studies without bronchodilators revealed a normal FEV1/FVC ratio with  normal FVC and FEV1.  No changes after bronchodilators noted.       Lung volumes are normal with the exception of a mild reduction in residual  volume with normal diffusion capacity.      IMPRESSION:   1.  No evidence of obstruction to airflow.  2.  No evidence of restrictive lung disease.                                             Cammie Faulstich Sherian MaroonJ DORTIN, DO     [id number]  DD: 01/25/2014 14:38  DT: 01/25/2014 14:58  Job #: [id number]  CC: Darin Arndt Lorel MonacoJ NARTKER, MD  CC: August AlbinoARA JOSHI ADHIKARI, MD

## 2014-01-27 LAB — ENA 1 - ANTI SMITH & ANTI RNP
Anti-RNP: NEGATIVE EU
ENA Smith (SM) Ab: NEGATIVE EU

## 2014-01-27 LAB — LUPUS ANTICOAGULANT
DRVVT,DIL: 40 s (ref 33–44)
PT D: 12.3 s (ref 12.0–15.5)
PTT D: 39 s (ref 32–48)
dRVVT Screen: 47 s — ABNORMAL HIGH (ref 33–44)

## 2014-01-27 LAB — ENA 2 - ANTI SSA & ANTI SSB
ENA SSA (RO) Ab: NEGATIVE EU
ENA SSB (LA) Ab: NEGATIVE EU

## 2014-01-27 LAB — CARDIOLIPIN ANTIBODIES IGG & IGM
Anticardiolipin IgG: 2 [GPL'U] (ref 0–14)
Cardiolipin Ab IgM: 4 [MPL'U] (ref 0–12)

## 2014-01-27 LAB — BETA-2 GLYCOPROTEIN ANTIBODIES
Beta-2 Glyco 1 IgG: 0 SGU (ref 0–20)
Beta-2 Glyco 1 IgM: 2 SMU (ref 0–20)

## 2014-01-27 LAB — ANTI-DNA ANTIBODY, DOUBLE-STRANDED: Double Stranded Dna Ab, Igg: NOT DETECTED

## 2014-01-27 LAB — CYCLIC CITRUL PEPTIDE ANTIBODY, IGG: CCP IgG Antibodies: 4 Units (ref 0–19)

## 2014-02-03 NOTE — Progress Notes (Signed)
[facility]                                                                                                                      Norm Saltara J Chyann Ambrocio, MD                                                             [phone]([phone] (F)    Primary provider: Anda Kraftavid Nartker, MD  Patient identification: Stefanie Marshall,DOB: 07/25/1956,Sex: female     1. CREST syndrome (HCC)    2. Raynaud's phenomenon      Subjective: Returns for follow up for partial CREST and go over work up. All blood work eval, baseline PFT and echo is within normal limits.  She continues to have persistent Raynaud's phenomena ( onset Aug 2014)  triphasic, associated with paresthesias of her fingers and toes -however she has been very careful in terms of protection which is helping.  She is tolerating amlodipine well.  No history of any blisters, ulcers or tissue loss in fingers or toes.  GERD is under better control with increasing the dose of omeprazole.  Denies any joint pain, rashes, skin thickening, chest pain, shortness of breath, edema. Rest ROS are negative.    No past medical history on file.   Past Surgical History   Procedure Laterality Date   ??? Upper gastrointestinal endoscopy  07/15/09     Distal esophagus was dilated to 15mm- schedule for barium swallow   ??? Upper gastrointestinal endoscopy  08-21-09     esophagus was pneumatically dilated Dr. June LeapLoewenstine   ??? Colonoscopy  01-23-11     Dr. June LeapLoewenstine sigmoid diverticulosis, benign poly recheckin 10 years   ??? Upper gastrointestinal endoscopy  03/03/2012      Dr. June LeapLoewenstine  - minimal reflux    ??? Upper gastrointestinal endoscopy  01/11/2014     Dr.Loewenstine- benign polyp, reflux,     History   Substance Use Topics   ??? Smoking status: Never Smoker    ??? Smokeless tobacco: Never Used   ??? Alcohol Use: Yes      Comment: Socially     Family History   Problem Relation Age of Onset   ??? Diabetes Brother [id number]      Medications:  Current Outpatient Prescriptions   Medication Sig Dispense Refill   ??? omeprazole (PRILOSEC) 20 MG capsule TAKE ONE CAPSULE BY MOUTH TWICE A DAY  60 capsule  5   ??? amLODIPine (NORVASC) 2.5 MG tablet Take 1 tab po daily  90 tablet  1   ??? omeprazole (PRILOSEC) 20 MG capsule Take 40 mg by mouth 2 times daily.       ??? estradiol (ESTRACE) 2 MG tablet Take 2 mg by mouth daily.       ???  Dextromethorphan-Guaifenesin (MUCINEX DM) 30-600 MG TB12 Take  by mouth daily as needed.         ??? acetaminophen (TYLENOL) 500 MG tablet Take 500 mg by mouth every 4 hours as needed.         ??? NASONEX 50 MCG/ACT nasal spray USE 2 SPRAYS IN EACH NOSTRIL DAILY  17 g  1   ??? progesterone (PROMETRIUM) 200 MG capsule Take 200 mg by mouth. First 12 days of month         No current facility-administered medications for this visit.     Allergies:    Detrol; Erythromycin ethylsuccinate; Avelox; and Iodine    PHYSICAL EXAM:    Vitals:    BP 106/64    Pulse 68    Temp(Src) 98.5 ??F (36.9 ??C) (Oral)    Ht 5' (1.524 m)    Wt 126 lb (57.153 kg)    BMI 24.61 kg/m2     General appearance: alert, appears stated age and cooperative.   MKS: Normal gait. Nail fold capillary dilations in all finger nail beds. Normal finger pulps. No finger to toe scares. Normal radial arteries b/l. Toes- no scars, normal pulp. Dusky blue coloration of fingers and toes. Normal dorsalis ped and post tibial pulses. Normal skin texture. No teleangiectases.  Shoulders, elbows, wrists, finger joints b/l, hips, knees, ankles, feet:   There is no evidence of any swelling, synovitis, warmth, tenderness or limitation of motion of any of the patient's upper or lower peripheral joints.  - Strength is  5/5   in both upper and lower extremities.  Skin: No rashes, no induration or skin thickening or nodules. No evidence ischemia or deformities noted in digits or nails.  HEENT: Normal lids, lacrimal glands and pupils. No oral or nasal ulcers. Salivary glands reveal no evidence of  abnormality. External inspection of the ears and nose within normal limits.  Neck: No masses or asymmetry. No thyroid enlargement.  Chest: Normal effort, clear to auscultation.  Heart:  Normal s1/s2, no leg edema.  Neurologic: normal deep tendon reflexes. No foot or wrist drop.          DATA:   Lab Results   Component Value Date    WBC 9.6 01/01/2014    HGB 13.8 01/01/2014    HCT 40.9 01/01/2014    MCV 93.6 01/01/2014    PLT 345 01/01/2014         Chemistry        Component Value Date/Time    NA 146* 01/01/2014 1023    K 4.6 01/01/2014 1023    CL 104 01/01/2014 1023    CO2 25 01/01/2014 1023    BUN 13 01/01/2014 1023    CREATININE 0.8 01/01/2014 1023        Component Value Date/Time    CALCIUM 9.7 01/01/2014 1023    ALKPHOS 44 01/01/2014 1023    AST 17 01/01/2014 1023    ALT 11 01/01/2014 1023    BILITOT 0.4 01/01/2014 1023        Lab Results   Component Value Date    SEDRATE 6 01/01/2014     Lab Results   Component Value Date    ANA POSITIVE* 01/01/2014   11-1278 Centromere pattern  Rf 14  ENA, scl 70 - negative.    Lab Results   Component Value Date    VITD25 58 06/27/2009         Radiology Review:    Ba swallow- GERD, no dysmotility seen.  PFT- Normal  Echo-Pulmonic Valve  The pulmonic valve is normal in structure and function. No evidence of  pulmonic valve regurgitation.   Estimated RAP:10 mmHg  Estimated RVSP: 35 mmHg    Assessment:   1. CREST syndrome (HCC)    2. Raynaud's phenomenon      58 year old pleasant Caucasian female with partial CREST Syndrome ( Raynaud's phenomenon, GERD, nailfold capillary dilatations, positive ANA in centimeter pattern consistent with). No other manifestations at this time.    Plan:  -Discussed about diagnosis, prognosis and management options.  1.  Raynaud's phenomena-discussed in detail about protective measures to prevent tissue loss.  Continue  amlodipine 2.5 mg daily, plan to increase dosage in fall/early winter depending on the need.    2.  GERD - omeprazole twice a day.  -I also gave her  reading information on CREST.  She was asked to monitor for any skin changes or fingertip ulcerations or blisters, in that case will optimize treatment.    Reassessment in 6-9 months.    Patient indicates understanding and agrees with the management plan.  I reviewed patients medical information and medical history in the medical records.    I thank you for giving me the opportunity to be involved in Arietta Haston's care and I look forward following Natalyah along with you. If you have any questions or concerns please feel free to contact me at any time.    Norm Salt, MD  [facility]  [address]  [address]  [place], [place] [address]  Phone - [phone]  Fax- [phone]    NOTE: This report was transcribed using voice recognition software. Every effort was made to ensure accuracy; however, inadvertent computerized transcription errors may be present.

## 2014-07-30 MED ORDER — OMEPRAZOLE 20 MG PO CPDR
20 MG | ORAL_CAPSULE | ORAL | Status: DC
Start: 2014-07-30 — End: 2015-01-17

## 2015-01-18 MED ORDER — OMEPRAZOLE 20 MG PO CPDR
20 MG | ORAL_CAPSULE | ORAL | Status: DC
Start: 2015-01-18 — End: 2015-01-27

## 2015-01-18 NOTE — Telephone Encounter (Signed)
Needs complete physical examination , last seen 11/2013

## 2015-01-18 NOTE — Telephone Encounter (Signed)
Patient scheduled.

## 2015-01-27 ENCOUNTER — Ambulatory Visit
Admit: 2015-01-27 | Discharge: 2015-01-27 | Payer: PRIVATE HEALTH INSURANCE | Attending: Internal Medicine | Primary: Oncology

## 2015-01-27 DIAGNOSIS — Z0000 Encounter for general adult medical examination without abnormal findings: Secondary | ICD-10-CM

## 2015-01-27 LAB — CBC
Basophils %: 0.3 %
Basophils Absolute: 24 /??L
Eosinophils %: 0.6 %
Eosinophils Absolute: 47 /??L
Hematocrit: 41.7 % (ref 36–46)
Hemoglobin: 13.7 g/dL (ref 12.0–16.0)
Lymphocytes %: 24.7 %
Lymphocytes Absolute: 1951 /??L
MCH: 29.6 pg
MCHC: 32.9 g/dL
MCV: 90.2 fL
MPV: 9.6 fL
Monocytes %: 5 %
Monocytes Absolute: 395 /??L
Neutrophils %: 69.4 %
Neutrophils Absolute: 5483 /??L
Platelets: 327 K/??L
RBC: 4.62 10^6/??L
WBC: 7.9 10^3/mL

## 2015-01-27 LAB — LIPID PANEL
Chol/HDL Ratio: 4.9
Chol/HDL Ratio: 4.9
Cholesterol, Total: 196
Cholesterol, Total: 196
HDL: 40 mg/dL (ref 35–70)
HDL: 40 mg/dL (ref 35–70)
LDL Calculated: 113 mg/dL (ref 0–160)
LDL Calculated: 113 mg/dL (ref 0–160)
Triglycerides: 214 mg/dL
Triglycerides: 214 mg/dL

## 2015-01-27 LAB — COMPREHENSIVE METABOLIC PANEL
ALT: 10 U/L
ALT: 10 U/L
AST: 14 U/L
AST: 14 U/L
Albumin: 3.9
Albumin: 3.9
Alkaline Phosphatase: 39 U/L
Alkaline Phosphatase: 39 U/L
BUN: 17 mg/dL
BUN: 17 mg/dL
CO2: 27 mmol/L
CO2: 27 mmol/L
Calcium: 9.8 mg/dL
Calcium: 9.8 mg/dL
Chloride: 103 mmol/L
Chloride: 103 mmol/L
Creatinine: 0.87
Creatinine: 0.87
Gfr Calculated: 73
Glucose: 95 mg/dL
Glucose: 95 mg/dL
Potassium: 4.4 mmol/L
Potassium: 4.4 mmol/L
Sodium: 139 mmol/L
Sodium: 139 mmol/L
Total Bilirubin: 0.4 mg/dL (ref 0.1–1.4)
Total Bilirubin: 0.4 mg/dL (ref 0.1–1.4)
Total Protein: 6.3
Total Protein: 6.3

## 2015-01-27 LAB — CBC WITH AUTO DIFFERENTIAL
Basophils %: 0.3 %
Basophils Absolute: 24 /??L
Eosinophils %: 0.6 %
Eosinophils Absolute: 47 /??L
Hematocrit: 41.7 % (ref 36–46)
Hemoglobin: 13.7 g/dL (ref 12.0–16.0)
Lymphocytes %: 24.7 %
Lymphocytes Absolute: 1951 /??L
MCH: 29.6 pg
MCHC: 32.9 g/dL
MCV: 90.2 fL
MPV: 9.6 fL
Monocytes %: 5 %
Monocytes Absolute: 395 /??L
Neutrophils %: 69.4 %
Neutrophils Absolute: 5483 /??L
Platelets: 327 K/??L
RBC: 4.62 10^6/??L
RDW: 13.5 %
WBC: 7.9 10^3/mL

## 2015-01-27 LAB — TSH
TSH: 0.71 u[IU]/mL
TSH: 0.71 u[IU]/mL

## 2015-01-27 NOTE — Progress Notes (Signed)
Subjective:      Patient ID: Stefanie Marshall is a 59 y.o. female.    HPI Comments: 01/27/2015   Has noted over the past couple weeks some troubles swallowing liquids , no chocking , no problems with solids at this time but is avoiding meat and pasta   raynauds is stable or slightly progressing   stable skin lesion left temple , was seen per dermatologist    No longer on vitamin - D which was low in the past     Patient Active Problem List    GERD (gastroesophageal reflux disease)         Priority: High (1)         Date Noted: 08/04/2009         Overview Note:            barium swallow 07/28/2009 - small HH with mild            reflux , dilatation 8 /2010      Allergic rhinitis         Priority: High (1)    Urge incontinence         Priority: Medium (2)    Symptomatic menopausal or female climacteric states         Priority: Medium (2)    Backache         Priority: Medium (2)    Benign neoplasm of breast         Priority: Medium (2)    Irritable bowel syndrome         Priority: Medium (2)    Preventative health care         Priority: Low (3)         Date Noted: 04/01/2010         Overview Note:            complete physical examination 01/27/2015             mammogram 03/30/2010, 05/02/2011, 05/07/2012,            05/17/2014   within normal             limits             DEXA 01/2015 - spine 0.4, hip -0.2, neck -0.7         Rosacea         Priority: Low (3)    Headache         Priority: Low (3)    Family history of diabetes mellitus (DM)         Date Noted: 01/01/2014    Raynaud's phenomenon         Date Noted: 01/01/2014    Diverticulosis         Date Noted: 11/19/2013         Overview Note:            CT 11/13/2013 - asymptomatic       Cholelithiasis         Date Noted: 02/20/2012         Overview Note:            ultrasound 02/19/2012 and CT 11/13/2013  - followed            per Dr. June Leap          Allergies, intolerances   -- Detrol (Tolterodine Tartrate)     --  headache   -- Erythromycin Ethylsuccinate     --  nausea    --  Avelox (Moxifloxacin Hcl In Nacl)     --  myalgias   -- Iodine      Current Outpatient Prescriptions on File Prior to Visit:  omeprazole (PRILOSEC) 20 MG capsule, Take 40 mg by mouth Daily , Disp: , Rfl:   estradiol (ESTRACE) 2 MG tablet, Take 2 mg by mouth daily., Disp: , Rfl:   Dextromethorphan-Guaifenesin (MUCINEX DM) 30-600 MG TB12, Take  by mouth daily as needed.  , Disp: , Rfl:   acetaminophen (TYLENOL) 500 MG tablet, Take 500 mg by mouth every 4 hours as needed.  , Disp: , Rfl:   progesterone (PROMETRIUM) 200 MG capsule, Take 200 mg by mouth. First 12 days of month, Disp: , Rfl:   NASONEX 50 MCG/ACT nasal spray, USE 2 SPRAYS IN EACH NOSTRIL DAILY, Disp: 17 g, Rfl: 1    No current facility-administered medications on file prior to visit.     All of the patient's medications including over the counter medications were reviewed and potential side effects discussed.      Immunization History  Administered            Date(s) Administered    Influenza Virus Vaccine                          09/02/2013      Tdap (Boostrix, Adacel)                          09/01/2013       Past Surgical History:    UPPER GASTROINTESTINAL ENDOSCOPY                 07/15/09         Comment:Distal esophagus was dilated to 15mm- schedule                for barium swallow    UPPER GASTROINTESTINAL ENDOSCOPY                 08-21-09         Comment:esophagus was pneumatically dilated Dr.                June LeapLoewenstine    COLONOSCOPY                                      01-23-11          Comment:Dr. June LeapLoewenstine sigmoid diverticulosis, benign                poly recheckin 10 years    UPPER GASTROINTESTINAL ENDOSCOPY                 03/03/2012       Comment:Dr. June LeapLoewenstine  - minimal reflux     UPPER GASTROINTESTINAL ENDOSCOPY                 01/11/2014       Comment:Dr.Loewenstine- benign polyp, reflux,     Review of patient's family history indicates:    Diabetes                       Brother                   High Cholesterol               Mother  Smoking Status: Never Smoker                      Smokeless Status: Never Used                        Alcohol Use: Yes                Comment: Socially        ----------------------------                   01/27/15                          0953           ----------------------------     BP:            112/62           Pulse:           72             Temp:    97.9 ??F (36.6 ??C)      TempSrc:        Oral            Resp:            16             Height:  5' 0.5" (1.537 m)      Weight:  127 lb (57.607 kg)    ----------------------------              Review of Systems   Constitutional: Negative.    HENT: Negative.    Eyes: Negative.    Respiratory: Negative.    Cardiovascular: Negative.    Gastrointestinal: Negative.         See HPI    Endocrine: Negative.    Genitourinary: Positive for urgency and frequency.   Skin: Positive for color change.   Allergic/Immunologic: Negative.    Neurological: Negative.    Hematological: Negative.    Psychiatric/Behavioral: Positive for sleep disturbance.   All other systems reviewed and are negative.      Objective:   Physical Exam   Constitutional: Vital signs are normal. She appears well-developed and well-nourished. She is cooperative. No distress.   HENT:   Head: Normocephalic and atraumatic.   Right Ear: Hearing, tympanic membrane, external ear and ear canal normal. No drainage, swelling or tenderness. No decreased hearing is noted.   Left Ear: Hearing, tympanic membrane, external ear and ear canal normal. No drainage, swelling or tenderness. No decreased hearing is noted.   Nose: Nose normal. Right sinus exhibits no maxillary sinus tenderness and no frontal sinus tenderness. Left sinus exhibits no maxillary sinus tenderness and no frontal sinus tenderness.   Mouth/Throat: Uvula is midline, oropharynx is clear and moist and mucous membranes are normal. She does not have dentures. Normal dentition. No dental caries. No oropharyngeal exudate, posterior  oropharyngeal edema, posterior oropharyngeal erythema or tonsillar abscesses.   Eyes: Conjunctivae and EOM are normal. Pupils are equal, round, and reactive to light.   fundoscopy within normal limits    Neck: Normal range of motion. Neck supple. Normal carotid pulses, no hepatojugular reflux and no JVD present. No tracheal tenderness, no spinous process tenderness and no muscular tenderness present. Carotid bruit is not present. No rigidity. No tracheal deviation and normal range of motion present. No thyroid mass and no thyromegaly present.  Cardiovascular: Normal rate, regular rhythm, S1 normal, S2 normal, normal heart sounds, intact distal pulses and normal pulses.   No extrasystoles are present. Exam reveals no gallop, no S3, no S4, no distant heart sounds and no friction rub.    No murmur heard.  Pulses:       Carotid pulses are 2+ on the right side, and 2+ on the left side.       Radial pulses are 2+ on the right side, and 2+ on the left side.        Femoral pulses are 2+ on the right side, and 2+ on the left side.       Dorsalis pedis pulses are 2+ on the right side, and 2+ on the left side.   Pulmonary/Chest: Effort normal and breath sounds normal. No respiratory distress. She has no decreased breath sounds. She has no wheezes. She has no rhonchi. She has no rales. She exhibits no tenderness.   Abdominal: Soft. Bowel sounds are normal. She exhibits no distension and no mass. There is no hepatosplenomegaly, splenomegaly or hepatomegaly. There is no tenderness. There is no rebound, no guarding and no CVA tenderness. No hernia. Hernia confirmed negative in the ventral area.   Musculoskeletal: Normal range of motion. She exhibits no edema or tenderness.   otherwise unremarkable    Lymphadenopathy:        Head (right side): No submental, no submandibular and no tonsillar adenopathy present.        Head (left side): No submental, no submandibular and no tonsillar adenopathy present.     She has no cervical  adenopathy.        Right: No inguinal and no supraclavicular adenopathy present.        Left: No inguinal and no supraclavicular adenopathy present.   Neurological: She is alert. She has normal strength and normal reflexes. She is not disoriented. She displays no atrophy and no tremor. No cranial nerve deficit or sensory deficit. She exhibits normal muscle tone. She displays no seizure activity. Coordination and gait normal.   Reflex Scores:       Tricep reflexes are 2+ on the right side and 2+ on the left side.       Bicep reflexes are 2+ on the right side and 2+ on the left side.       Patellar reflexes are 2+ on the right side.       Achilles reflexes are 2+ on the right side and 2+ on the left side.  Skin: Skin is warm and dry. She is not diaphoretic.   Benign seborrheic keratosis  over the left temple   Psychiatric: She has a normal mood and affect. Her speech is normal and behavior is normal. Judgment and thought content normal. Cognition and memory are normal.   Nursing note and vitals reviewed.     ASSESSMENT and PLAN ;     1.  Physical exam Overall healthy.Improvements in lifestyle with diet and increase exercise encouraged  POCT Urinalysis no Micro    2.  Preventative health care Due for [facility] scan.  CANCELED: Lipid Panel    3.  Gastroesophageal reflux disease without esophagitis see below        4.  Raynaud's phenomenon without gangrene Stable as long as she avoids cold exposure.Avoid caffeine.  Sedimentation rate, automated      CANCELED: Comprehensive Metabolic Panel      CANCELED: CBC Auto Differential      CANCELED: TSH without Reflex  5.  Calculus of gallbladder without cholecystitis without obstruction Asymptomatic.      6.  Diverticulosis of large intestine without hemorrhage High fiber diet.      7.  Irritable bowel syndrome without diarrhea High fiber diet.      8.  Symptomatic menopausal or female climacteric states See below. At this time she does not wish to making changes for the hot  slices.  CANCELED: DEXA Bone Density Axial    9.  Urge incontinence non- pharmacological measures discussed  See below.      10.  Vitamin D deficiency   CANCELED: Vitamin D 25 Hydroxy    11.  Hematuria   CANCELED: URINE CULTURE          All above medical conditions have been reviewed and assessed to be stable therefore will continue current treatment with the exception of that stated below .    New Prescriptions      No medications on file        Patient Instructions    follow up  Dr. June Leap for repeat EGD   If swallowing issues persist or worsen then call for modified barium swallow as discussed   DEXA scan for bone density   For the sleep avoid fluids at night , consider medications for the bladder as discussed   Consider adding effexor or adjusting the estrogen per Gynecologist for the hot flashes        No Follow-up on file.    No results found for this visit on 01/27/15.     [name], MD    Orders Placed This Encounter    Procedures    ???  Sedimentation rate, automated    ???  POCT Urinalysis no Micro

## 2015-01-27 NOTE — Telephone Encounter (Signed)
Spoke with Marylene Landngela; informed her the labs for patient need to go to [facility] or [facility] per insurance.

## 2015-01-27 NOTE — Patient Instructions (Addendum)
follow up  Dr. June LeapLoewenstine for repeat EGD   If swallowing issues persist or worsen then call for modified barium swallow as discussed   DEXA scan for bone density   For the sleep avoid fluids at night , consider medications for the bladder as discussed   Consider adding effexor or adjusting the estrogen per Gynecologist for the hot flashes

## 2015-01-31 LAB — REFERENCE LAB SAMPLE

## 2015-02-08 ENCOUNTER — Encounter

## 2015-02-08 ENCOUNTER — Inpatient Hospital Stay: Admit: 2015-02-08 | Attending: Internal Medicine | Primary: Oncology

## 2015-02-08 DIAGNOSIS — N951 Menopausal and female climacteric states: Secondary | ICD-10-CM

## 2015-02-08 NOTE — Progress Notes (Signed)
Quick Note:    Tell DEXA is unremarkable , stay active , calcium 1500 mg /day and vitamin - D 1000 units / day are recommended to keep good bone health  ______

## 2015-02-23 MED ORDER — OMEPRAZOLE 20 MG PO CPDR
20 MG | ORAL_CAPSULE | ORAL | Status: DC
Start: 2015-02-23 — End: 2016-04-19

## 2015-05-31 MED ORDER — OMEPRAZOLE 20 MG PO CPDR
20 MG | ORAL_CAPSULE | ORAL | 5 refills | Status: DC
Start: 2015-05-31 — End: 2016-11-06

## 2016-01-02 MED ORDER — OMEPRAZOLE 20 MG PO CPDR
20 MG | ORAL_CAPSULE | Freq: Every day | ORAL | 5 refills | Status: DC
Start: 2016-01-02 — End: 2016-11-06

## 2016-04-19 NOTE — Telephone Encounter (Signed)
Patient is requesting 90 days for express scripts for her omeprazole.    Needs to be addressed by 04-21-16.     [facility] [place] - [address] - P [phone] F [phone]

## 2016-04-24 MED ORDER — OMEPRAZOLE 20 MG PO CPDR
20 MG | ORAL_CAPSULE | Freq: Every day | ORAL | 3 refills | Status: DC
Start: 2016-04-24 — End: 2017-05-31

## 2016-11-06 ENCOUNTER — Ambulatory Visit
Admit: 2016-11-06 | Discharge: 2016-11-06 | Payer: PRIVATE HEALTH INSURANCE | Attending: Internal Medicine | Primary: Oncology

## 2016-11-06 DIAGNOSIS — K219 Gastro-esophageal reflux disease without esophagitis: Secondary | ICD-10-CM

## 2016-11-06 NOTE — Assessment & Plan Note (Signed)
Saw Dr Renford DillsAdhikari  Does not want to take Norvasc  Monitor symptoms

## 2016-11-06 NOTE — Assessment & Plan Note (Signed)
Stable  Continue treatment  Doing well

## 2016-11-06 NOTE — Progress Notes (Signed)
Subjective:      Patient ID: Stefanie KindJan Marshall is a 60 y.o. y.o. female.    HPI    Patient is here for a new patient visit    Past Medical History:   Diagnosis Date   ??? CREST (calcinosis, Raynaud's phenomenon, esophageal dysfunction, sclerodactyly, telangiectasia) (HCC)    ??? GERD (gastroesophageal reflux disease)        Past Surgical History:   Procedure Laterality Date   ??? COLONOSCOPY  01-23-11    Dr. June LeapLoewenstine sigmoid diverticulosis, benign poly recheckin 10 years   ??? UPPER GASTROINTESTINAL ENDOSCOPY  07/15/09    Distal esophagus was dilated to 15mm- schedule for barium swallow   ??? UPPER GASTROINTESTINAL ENDOSCOPY  08-21-09    esophagus was pneumatically dilated Dr. June LeapLoewenstine   ??? UPPER GASTROINTESTINAL ENDOSCOPY  03/03/2012     Dr. June LeapLoewenstine  - minimal reflux    ??? UPPER GASTROINTESTINAL ENDOSCOPY  01/11/2014    Dr.Loewenstine- benign polyp, reflux,       Social History     Social History   ??? Marital status: Single     Spouse name: N/A   ??? Number of children: 0   ??? Years of education: 3116     Occupational History   ??? Business analyst [employer]     Social History Main Topics   ??? Smoking status: Never Smoker   ??? Smokeless tobacco: Never Used   ??? Alcohol use Yes      Comment: socially   ??? Drug use: No   ??? Sexual activity: Not on file     Other Topics Concern   ??? Not on file     Social History Narrative   ??? No narrative on file       Family History   Problem Relation Age of Onset   ??? Diabetes Brother    ??? High Cholesterol Mother    ??? Cancer Father      Eye       Vitals:    11/06/16 1553   BP: 106/66   Pulse: 60   SpO2: 99%       Wt Readings from Last 3 Encounters:   11/06/16 129 lb 9.6 oz (58.8 kg)   01/27/15 127 lb (57.6 kg)   02/03/14 126 lb (57.2 kg)       Review of Systems   Constitutional: Negative.    HENT: Negative.    Respiratory: Negative.  Negative for chest tightness, shortness of breath and wheezing.    Cardiovascular: Negative.  Negative for chest pain, palpitations and leg swelling.   Gastrointestinal: Negative.     Genitourinary: Negative.    Musculoskeletal: Negative for arthralgias and myalgias.   Neurological: Negative.      She has no dyspepsia with medication  She has some Raynaud's symptoms.  She saw Dr Renford DillsAdhikari and was treated with Norvasc.  She did not want to take this medication.    Objective:   Physical Exam   Constitutional: She appears well-developed and well-nourished.   Eyes: Conjunctivae and EOM are normal.   Neck: No JVD present. Carotid bruit is not present. No thyroid mass and no thyromegaly present.   Cardiovascular: Normal rate, regular rhythm and normal heart sounds.    No murmur heard.  Pulmonary/Chest: Effort normal and breath sounds normal. She has no wheezes.   Musculoskeletal: She exhibits no edema.       Assessment:      See Problem List assessment and plan  Plan:      GERD (gastroesophageal reflux disease)  Stable  Continue treatment  Doing well    Raynaud's phenomenon  Saw Dr Renford DillsAdhikari  Does not want to take Norvasc  Monitor symptoms

## 2016-11-23 ENCOUNTER — Encounter: Attending: Internal Medicine | Primary: Oncology

## 2017-01-24 ENCOUNTER — Ambulatory Visit
Admit: 2017-01-24 | Discharge: 2017-01-24 | Payer: PRIVATE HEALTH INSURANCE | Attending: Rheumatology | Primary: Oncology

## 2017-01-24 DIAGNOSIS — M341 CR(E)ST syndrome: Secondary | ICD-10-CM

## 2017-01-24 NOTE — Progress Notes (Signed)
[facility] Rheumatology                                                                                                                 Norm Salt, MD                                                           [address] [place], [place] [address]                                                             [phone] ([phone])      Dear Dr. Danelle Earthly, MD:  Please find Rheumatology assessment.Thank you for giving me the opportunity to be involved in Stefanie Marshall's care and I look forward following Felipe along with you. If you have any questions or concerns please feel free to reach me.    Note is transcribed using voice recognition software. Inadvertent computerized transcription errors may be present.    Primary provider: Danelle Earthly, MD  Patient identification: Stefanie Marshall: [birth year],61 y.o.  Sex: female     Assessment / Plan:  1. CREST syndrome (HCC)  + ANA- 1:1280 centromere pattern  Last seen my me > 3 years ago.  Stable. Is not taking Amlodipine any longer for Raynaud's, managed with conservative measures. GERD symptoms controlled with Omeprazole. Sees GI. Need to check Echo for pul HTN and baseline labs.  - ECHO Complete 2D W Doppler W Color  - Urinalysis; Future  - Comprehensive Metabolic Panel; Future  - CBC Auto Differential; Future    2. Arthralgia of right hand  Likely secondary to osteoarthritis and tendon inflammation given pain is worse with activity. Encouraged hand exercises and stretches. Takes Tylenol PRN.   Reassured patient that she does not have inflammatory arthritis.  Follow-up in one year.  Patient indicates understanding and agrees with the management plan.  I reviewed patient's history, referral documents and electronic medical records.  #######################################################################    REASON FOR CONSULTATION:  Rheumatology consultation for evaluation  of right hand and elbow pain    HISTORY OF PRESENT ILLNESS:  61 y.o. female seen prior for partial CREST syndrome in 2015 comes back with complaints of right hand and elbow pain for several months. Associated with occasional numbness at the dorsum of R hand, mild, intermittent symptoms, worse while driving while gripping steering wheel. No particular morning stiffness present. No associated swelling.  Pt is left handed. Endorses similar pain in left hand as well. Will take Tylenol with some mild relief. Her Raynaud's phenomena persists, but she is doing  ok without Amlodipine.GERD stable. No Shortness of breath, leg swelling, skin changes, or calcifications. Seeing GI for GERD- Dr Evlyn [facility].    Pertinent ROS: Denies weight loss, objective fevers, skin rashes or skin thickening, photosensitivity, focal alopecia, recurrent ocular congestion, dry eyes or mouth, or mucosal ulcers, tinnitus or recent hearing loss. Denies chest pain, palpitations, cough, pleurisy, dysphagia, or features of inflammatory bowel diseases.  No h/o blood clots or bleeding disorders. No renal or genitourinary problems. No focal weakness.  All other ROS are negative.    Past Medical History:   Diagnosis Date   ??? CREST (calcinosis, Raynaud's phenomenon, esophageal dysfunction, sclerodactyly, telangiectasia) (HCC)    ??? GERD (gastroesophageal reflux disease)      Past Surgical History:   Procedure Laterality Date   ??? COLONOSCOPY  01-23-11    Dr. June LeapLoewenstine sigmoid diverticulosis, benign poly recheckin 10 years   ??? UPPER GASTROINTESTINAL ENDOSCOPY  07/15/09    Distal esophagus was dilated to 15mm- schedule for barium swallow   ??? UPPER GASTROINTESTINAL ENDOSCOPY  08-21-09    esophagus was pneumatically dilated Dr. June LeapLoewenstine   ??? UPPER GASTROINTESTINAL ENDOSCOPY  03/03/2012     Dr. June LeapLoewenstine  - minimal reflux    ??? UPPER GASTROINTESTINAL ENDOSCOPY  01/11/2014    Dr.Loewenstine- benign polyp, reflux,       No family history of autoimmune diseases    Current  Outpatient Prescriptions   Medication Sig Dispense Refill   ??? omeprazole (PRILOSEC) 20 MG delayed release capsule Take 1 capsule by mouth Daily 90 capsule 3   ??? Cholecalciferol (VITAMIN D) 2000 UNITS CAPS capsule Take 1 capsule by mouth daily     ??? estradiol (ESTRACE) 2 MG tablet Take 2 mg by mouth daily.     ??? Dextromethorphan-Guaifenesin (MUCINEX DM) 30-600 MG TB12 Take  by mouth daily as needed.       ??? acetaminophen (TYLENOL) 500 MG tablet Take 500 mg by mouth every 4 hours as needed.       ??? NASONEX 50 MCG/ACT nasal spray USE 2 SPRAYS IN EACH NOSTRIL DAILY 17 g 1   ??? progesterone (PROMETRIUM) 200 MG capsule Take 200 mg by mouth. First 12 days of month       No current facility-administered medications for this visit.      Allergies   Allergen Reactions   ??? Detrol [Tolterodine Tartrate]      headache   ??? Erythromycin Ethylsuccinate Nausea Only   ??? Avelox [Moxifloxacin Hcl In Nacl] Other (See Comments)     myalgias   ??? Iodine Rash       PHYSICAL EXAM:    Vitals:    BP 110/72    Temp 98 ??F (36.7 ??C) (Oral)    Ht 5' (1.524 m)    Wt 128 lb (58.1 kg)    BMI 25.00 kg/m??   General appearance: alert, appears stated age and cooperative.   MKS: Right hand MCP's and right elbow tender to palpation. Able to make full fist. Mild tendon swelling around R middle finger MCP. No finger ulcerations.Pain with extension of R elbow. Shoulders, wrists, hips, knees, ankles, feet with no evidence of swelling, synovitis, warmth, tenderness. Full ROM. Strength 5/5 throughout upper and lower extremities. Rest of joints are WNL.  Spine- No scoliosis or kyphosis noted. Nontender.   Skin: No rashes, no induration or skin thickening or nodules. No evidence ischemia or deformities noted in digits or nails.  HEENT: Normal lids, lacrimal glands and pupils. No oral or nasal  ulcers. Salivary glands reveal no evidence of abnormality. External inspection of the ears and nose within normal limits.  Neck: No masses or asymmetry. No thyroid  enlargement.  Chest: Normal effort, clear to auscultation.  Heart:  Normal s1/s2, no leg edema.  Abdomen: soft, non-tender.    Neurologic: normal deep tendon reflexes. No foot or wrist drop.          DATA:   Lab Results   Component Value Date    WBC 7.9 01/27/2015    WBC 7.9 01/27/2015    HGB 13.7 01/27/2015    HGB 13.7 01/27/2015    HCT 41.7 01/27/2015    HCT 41.7 01/27/2015    MCV 90.2 01/27/2015    MCV 90.2 01/27/2015    PLT 327 01/27/2015    PLT 327 01/27/2015         Chemistry        Component Value Date/Time    NA 139 01/27/2015    NA 139 01/27/2015    K 4.4 01/27/2015    K 4.4 01/27/2015    CL 103 01/27/2015    CL 103 01/27/2015    CO2 27 01/27/2015    CO2 27 01/27/2015    BUN 17 01/27/2015    BUN 17 01/27/2015    CREATININE 0.87 01/27/2015    CREATININE 0.87 01/27/2015        Component Value Date/Time    CALCIUM 9.8 01/27/2015    CALCIUM 9.8 01/27/2015    ALKPHOS 39 01/27/2015    ALKPHOS 39 01/27/2015    AST 14 01/27/2015    AST 14 01/27/2015    ALT 10 01/27/2015    ALT 10 01/27/2015    BILITOT 0.4 01/27/2015    BILITOT 0.4 01/27/2015          No results found for: [facility]  Lab Results   Component Value Date    SEDRATE 6 01/01/2014     No results found for: CRP  Lab Results   Component Value Date    ANA POSITIVE (A) 01/01/2014    SEDRATE 6 01/01/2014     No results found for: CKTOTAL  Lab Results   Component Value Date    TSH 0.71 01/27/2015    TSH 0.71 01/27/2015     Lab Results   Component Value Date    VITD25 58 06/27/2009       A/P- See above.

## 2017-01-30 ENCOUNTER — Inpatient Hospital Stay: Admit: 2017-01-30 | Attending: Rheumatology | Primary: Oncology

## 2017-01-30 DIAGNOSIS — M341 CR(E)ST syndrome: Secondary | ICD-10-CM

## 2017-01-30 LAB — ECHOCARDIOGRAM COMPLETE 2D W DOPPLER W COLOR: Left Ventricular Ejection Fraction: 58

## 2017-02-05 ENCOUNTER — Encounter

## 2017-02-05 ENCOUNTER — Ambulatory Visit
Admit: 2017-02-05 | Discharge: 2017-02-05 | Payer: PRIVATE HEALTH INSURANCE | Attending: Cardiovascular Disease | Primary: Oncology

## 2017-02-05 DIAGNOSIS — R931 Abnormal findings on diagnostic imaging of heart and coronary circulation: Secondary | ICD-10-CM

## 2017-02-05 NOTE — Telephone Encounter (Signed)
-----   Message from Edgar FriskJulia Menetrey, NP sent at 02/01/2017  3:11 PM EDT -----  Have pt make fu visit   No hurry

## 2017-02-05 NOTE — Progress Notes (Signed)
Subjective:      Patient ID: Stefanie Marshall is a 61 y.o. female.    HPI Referred for abn echo. Had aneurysmal atrial septum. No previous cardiac hx.  No chest pain/sob.  Exercises.  Walks 3x per week.  No edema. No tachycardia.  No orthopnea.  No pnd     Past Medical History:   Diagnosis Date   ??? CREST (calcinosis, Raynaud's phenomenon, esophageal dysfunction, sclerodactyly, telangiectasia) (HCC)    ??? GERD (gastroesophageal reflux disease)      Past Surgical History:   Procedure Laterality Date   ??? COLONOSCOPY  01-23-11    Dr. June LeapLoewenstine sigmoid diverticulosis, benign poly recheckin 10 years   ??? UPPER GASTROINTESTINAL ENDOSCOPY  07/15/09    Distal esophagus was dilated to 15mm- schedule for barium swallow   ??? UPPER GASTROINTESTINAL ENDOSCOPY  08-21-09    esophagus was pneumatically dilated Dr. June LeapLoewenstine   ??? UPPER GASTROINTESTINAL ENDOSCOPY  03/03/2012     Dr. June LeapLoewenstine  - minimal reflux    ??? UPPER GASTROINTESTINAL ENDOSCOPY  01/11/2014    Dr.Loewenstine- benign polyp, reflux,     Social History     Social History   ??? Marital status: Single     Spouse name: N/A   ??? Number of children: 0   ??? Years of education: 8616     Occupational History   ??? Business analyst [employer]     Social History Main Topics   ??? Smoking status: Never Smoker   ??? Smokeless tobacco: Never Used   ??? Alcohol use Yes      Comment: socially   ??? Drug use: No   ??? Sexual activity: Not on file     Other Topics Concern   ??? Not on file     Social History Narrative   ??? No narrative on file     FH reviewed.  Fa CAD/PTCA beginning57.      Review of Systems   Constitutional: Negative for activity change, appetite change and fatigue.   Respiratory: Negative for cough, choking, chest tightness and shortness of breath.    Cardiovascular: Negative for chest pain, palpitations and leg swelling.        Denies PND or orthopnea. No tachycardia or syncope.    Neurological: Negative for dizziness, syncope and light-headedness.   Psychiatric/Behavioral: Negative for agitation,  behavioral problems and confusion.   All other systems reviewed and are negative.      Objective:   Physical Exam   Constitutional: She is oriented to person, place, and time. She appears well-developed and well-nourished. No distress.   HENT:   Head: Normocephalic and atraumatic.   Eyes: Conjunctivae and EOM are normal. Right eye exhibits no discharge. Left eye exhibits no discharge.   Neck: Normal range of motion. No JVD present.   Cardiovascular: Normal rate, regular rhythm, S1 normal, S2 normal and normal heart sounds.  Exam reveals no gallop.    No murmur heard.  Pulses:       Radial pulses are 2+ on the right side, and 2+ on the left side.   Pulmonary/Chest: Effort normal and breath sounds normal. No respiratory distress. She has no wheezes. She has no rales.   Abdominal: Soft. Bowel sounds are normal. There is no tenderness.   Musculoskeletal: Normal range of motion. She exhibits no edema.   Neurological: She is alert and oriented to person, place, and time.   Skin: Skin is warm and dry.   Psychiatric: She has a normal mood and affect.  Her behavior is normal. Thought content normal.       Assessment:      1. Abnormal echocardiogram  EKG 12 Lead           Plan:      Asymptomatic.  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  No need for intervention or changes.

## 2017-02-05 NOTE — Telephone Encounter (Signed)
Patient has appt with dr Philomena Course

## 2017-02-06 LAB — CBC WITH AUTO DIFFERENTIAL
Basophils %: 0.5 %
Basophils Absolute: 0 10*3/uL (ref 0.0–0.2)
Eosinophils %: 0.8 %
Eosinophils Absolute: 0.1 10*3/uL (ref 0.0–0.6)
Hematocrit: 41.2 % (ref 36.0–48.0)
Hemoglobin: 14.4 g/dL (ref 12.0–16.0)
Lymphocytes %: 26.9 %
Lymphocytes Absolute: 2 10*3/uL (ref 1.0–5.1)
MCH: 31.5 pg (ref 26.0–34.0)
MCHC: 34.8 g/dL (ref 31.0–36.0)
MCV: 90.4 fL (ref 80.0–100.0)
MPV: 8.3 fL (ref 5.0–10.5)
Monocytes %: 7 %
Monocytes Absolute: 0.5 10*3/uL (ref 0.0–1.3)
Neutrophils %: 64.8 %
Neutrophils Absolute: 4.8 10*3/uL (ref 1.7–7.7)
Platelets: 370 10*3/uL (ref 135–450)
RBC: 4.56 M/uL (ref 4.00–5.20)
RDW: 13.5 % (ref 12.4–15.4)
WBC: 7.4 10*3/uL (ref 4.0–11.0)

## 2017-02-06 LAB — COMPREHENSIVE METABOLIC PANEL
ALT: 9 U/L — ABNORMAL LOW (ref 10–40)
AST: 16 U/L (ref 15–37)
Albumin/Globulin Ratio: 2 (ref 1.1–2.2)
Albumin: 4.1 g/dL (ref 3.4–5.0)
Alkaline Phosphatase: 52 U/L (ref 40–129)
Anion Gap: 12 (ref 3–16)
BUN: 13 mg/dL (ref 7–20)
CO2: 28 mmol/L (ref 21–32)
Calcium: 9.7 mg/dL (ref 8.3–10.6)
Chloride: 97 mmol/L — ABNORMAL LOW (ref 99–110)
Creatinine: 0.7 mg/dL (ref 0.6–1.2)
GFR African American: >60 (ref >60)
GFR Non-African American: >60 (ref >60)
Globulin: 2.1 g/dL
Glucose: 91 mg/dL (ref 70–99)
Potassium: 4.5 mmol/L (ref 3.5–5.1)
Sodium: 137 mmol/L (ref 136–145)
Total Bilirubin: 0.3 mg/dL (ref 0.0–1.0)
Total Protein: 6.2 g/dL — ABNORMAL LOW (ref 6.4–8.2)

## 2017-02-06 LAB — URINALYSIS
Bilirubin Urine: NEGATIVE
Blood, Urine: NEGATIVE
Glucose, Ur: NEGATIVE mg/dL
Ketones, Urine: NEGATIVE mg/dL
Leukocyte Esterase, Urine: NEGATIVE
Nitrite, Urine: NEGATIVE
Protein, UA: NEGATIVE mg/dL
Specific Gravity, UA: 1.011 (ref 1.005–1.030)
Urobilinogen, Urine: 0.2 E.U./dL (ref <2.0)
pH, UA: 6 (ref 5.0–8.0)

## 2017-05-28 NOTE — Telephone Encounter (Signed)
Asking if the pharmacy in her file can be changed from [facility] to [facility]

## 2017-05-28 NOTE — Telephone Encounter (Signed)
Done

## 2017-05-31 MED ORDER — OMEPRAZOLE 20 MG PO CPDR
20 MG | ORAL_CAPSULE | Freq: Every day | ORAL | 0 refills | Status: DC
Start: 2017-05-31 — End: 2017-07-19

## 2017-05-31 NOTE — Telephone Encounter (Signed)
Medication pended to be signed

## 2017-05-31 NOTE — Telephone Encounter (Signed)
Patient requesting a medication refill.  Medication omeprazole  Dosage 20 mg delayed release  Frequency1 daily  [facility]16  [facility]     LOV: 11/06/16  No FOV    Formally filled by Dr Marshell LevanNartker

## 2017-06-24 NOTE — Telephone Encounter (Signed)
Pt stated she will just wait until it goes away on it's own.

## 2017-06-24 NOTE — Telephone Encounter (Signed)
Pt says she has pink eye in both eyes  Started Saturday night  Eyes are red, itchy, crusty, glued together in the morning, discharge  She is asking if Bleph 10 can be called into [facility]    LOV: 11/23/16  No FOV

## 2017-06-24 NOTE — Telephone Encounter (Signed)
Needs appointment please

## 2017-07-19 MED ORDER — OMEPRAZOLE 20 MG PO CPDR
20 MG | ORAL_CAPSULE | Freq: Every day | ORAL | 1 refills | Status: AC
Start: 2017-07-19 — End: ?

## 2017-07-19 NOTE — Telephone Encounter (Signed)
11/06/2016 Lov   No Fov

## 2019-02-09 DIAGNOSIS — M341 CR(E)ST syndrome: Secondary | ICD-10-CM | POA: Insufficient documentation

## 2019-02-09 DIAGNOSIS — K219 Gastro-esophageal reflux disease without esophagitis: Secondary | ICD-10-CM | POA: Insufficient documentation

## 2019-02-09 DIAGNOSIS — E559 Vitamin D deficiency, unspecified: Secondary | ICD-10-CM | POA: Insufficient documentation

## 2019-02-09 HISTORY — DX: Gastro-esophageal reflux disease without esophagitis: K21.9

## 2019-02-09 HISTORY — DX: Cr(e)st syndrome: M34.1

## 2019-04-09 ENCOUNTER — Other Ambulatory Visit: Payer: Self-pay | Admitting: Internal Medicine

## 2019-04-09 ENCOUNTER — Other Ambulatory Visit: Payer: Self-pay | Admitting: Psychiatry

## 2019-04-14 ENCOUNTER — Other Ambulatory Visit: Payer: Self-pay | Admitting: Internal Medicine

## 2019-04-14 DIAGNOSIS — N6310 Unspecified lump in the right breast, unspecified quadrant: Secondary | ICD-10-CM

## 2019-05-06 ENCOUNTER — Ambulatory Visit
Admission: RE | Admit: 2019-05-06 | Discharge: 2019-05-06 | Disposition: A | Discharge status: Home / Self Care | Payer: 59 | Source: Ambulatory Visit | Attending: Internal Medicine | Admitting: Internal Medicine

## 2019-05-06 ENCOUNTER — Other Ambulatory Visit: Payer: Self-pay

## 2019-05-06 DIAGNOSIS — N6310 Unspecified lump in the right breast, unspecified quadrant: Secondary | ICD-10-CM

## 2020-02-22 ENCOUNTER — Ambulatory Visit: Payer: Self-pay

## 2020-06-23 ENCOUNTER — Ambulatory Visit (INDEPENDENT_AMBULATORY_CARE_PROVIDER_SITE_OTHER): Payer: Medicare Other | Admitting: Dermatology

## 2020-06-23 ENCOUNTER — Other Ambulatory Visit: Payer: Self-pay

## 2020-06-23 DIAGNOSIS — L719 Rosacea, unspecified: Secondary | ICD-10-CM

## 2020-06-23 DIAGNOSIS — L988 Other specified disorders of the skin and subcutaneous tissue: Secondary | ICD-10-CM | POA: Diagnosis not present

## 2020-06-23 DIAGNOSIS — D229 Melanocytic nevi, unspecified: Secondary | ICD-10-CM

## 2020-06-23 DIAGNOSIS — Z1283 Encounter for screening for malignant neoplasm of skin: Secondary | ICD-10-CM

## 2020-06-23 DIAGNOSIS — D492 Neoplasm of unspecified behavior of bone, soft tissue, and skin: Secondary | ICD-10-CM | POA: Diagnosis not present

## 2020-06-23 DIAGNOSIS — Z86018 Personal history of other benign neoplasm: Secondary | ICD-10-CM

## 2020-06-23 HISTORY — DX: Personal history of other benign neoplasm: Z86.018

## 2020-06-23 MED ORDER — METRONIDAZOLE 0.75 % EX LOTN: TOPICAL_LOTION | CUTANEOUS | 3 refills | Status: DC

## 2020-06-23 NOTE — Patient Instructions (Signed)

## 2020-06-23 NOTE — Progress Notes (Signed)
New Patient Visit  Subjective  Katelyn Dennis is a 64 y.o. female who presents for the following: Annual Exam (New pt presents for TBSE, No hx of skin cancers ). No areas on concern today. The patient presents for Total-Body Skin Exam (TBSE) for skin cancer screening and mole check.  The following portions of the chart were reviewed this encounter and updated as appropriate:  Allergies  Meds  Problems  Med Hx  Surg Hx  Fam Hx     Review of Systems:  No other skin or systemic complaints except as noted in HPI or Assessment and Plan.  Objective  Well appearing patient in no apparent distress; mood and affect are within normal limits.  A full examination was performed including scalp, head, eyes, ears, nose, lips, neck, chest, axillae, abdomen, back, buttocks, bilateral upper extremities, bilateral lower extremities, hands, feet, fingers, toes, fingernails, and toenails. All findings within normal limits unless otherwise noted below.  Objective  Head - Anterior (Face): Mid face erythema with telangiectasias +/- scattered inflammatory papules.   Objective  hands: Hands clear today   Objective  Right epigastic area: 0.5 irregular macule   Objective  Right superior breast: 0.5 x 0.4 cm slightly speckled macule   Objective  lips: Rhytides and volume loss.    Assessment & Plan  Rosacea Head - Anterior (Face)  Start Metro lotion apply to skin qhs   Reordered Medications METRONIDAZOLE, TOPICAL, 0.75 % LOTN  Neoplasm of skin (2) hands  Right epigastic area  Skin / nail biopsy Type of biopsy: tangential   Informed consent: discussed and consent obtained   Patient was prepped and draped in usual sterile fashion: area prepped with alochol. Anesthesia: the lesion was anesthetized in a standard fashion   Anesthetic:  1% lidocaine w/ epinephrine 1-100,000 buffered w/ 8.4% NaHCO3 Instrument used: flexible razor blade   Hemostasis achieved with: pressure, aluminum  chloride and electrodesiccation   Outcome: patient tolerated procedure well   Post-procedure details: wound care instructions given   Post-procedure details comment:  Ointment and small bandage  Specimen 1 - Surgical pathology Differential Diagnosis: R/O Dysplastic nevus Check Margins: No 0.5 irregular macule  Hx of Crest   Pt report no hx Scleroderma   Nevus Right superior breast  Observe   Photo taken  Elastosis of skin lips  Recommed Volbella when pt return for her cosmetic appt in Sept   Skin cancer screening  Lentigines - Scattered tan macules - Discussed due to sun exposure - Benign, observe - Call for any changes  Seborrheic Keratoses - Stuck-on, waxy, tan-brown papules and plaques  - Discussed benign etiology and prognosis. - Observe - Call for any changes  Melanocytic Nevi - Tan-brown and/or pink-flesh-colored symmetric macules and papules - Benign appearing on exam today - Observation - Call clinic for new or changing moles - Recommend daily use of broad spectrum spf 30+ sunscreen to sun-exposed areas.   Hemangiomas - Red papules - Discussed benign nature - Observe - Call for any changes  Actinic Damage - diffuse scaly erythematous macules with underlying dyspigmentation - Recommend daily broad spectrum sunscreen SPF 30+ to sun-exposed areas, reapply every 2 hours as needed.  - Call for new or changing lesions.  Skin cancer screening performed today.  Return if symptoms worsen or fail to improve, for as scheduled for filler .  Marta Lamas, CMA, am acting as scribe for Sarina Ser, MD .  Documentation: I have reviewed the above documentation for accuracy and completeness,  and I agree with the above.  Sarina Ser, MD

## 2020-06-25 ENCOUNTER — Encounter: Payer: Self-pay | Admitting: Dermatology

## 2020-06-30 ENCOUNTER — Telehealth: Payer: Self-pay

## 2020-06-30 NOTE — Telephone Encounter (Signed)
Patient informed of pathology results. Will perform procedure at her next visit.

## 2020-06-30 NOTE — Telephone Encounter (Signed)
-----   Message from Ralene Bathe, MD sent at 06/29/2020 10:28 AM EDT ----- Skin , right epigastric area [place],  Moderate to severe dysplastic Schedule for shave removal

## 2020-07-28 ENCOUNTER — Ambulatory Visit: Payer: Medicare Other | Admitting: Dermatology

## 2020-08-04 ENCOUNTER — Encounter: Payer: Self-pay | Admitting: Dermatology

## 2020-08-04 ENCOUNTER — Ambulatory Visit (INDEPENDENT_AMBULATORY_CARE_PROVIDER_SITE_OTHER): Payer: Medicare Other | Admitting: Dermatology

## 2020-08-04 ENCOUNTER — Other Ambulatory Visit: Payer: Self-pay

## 2020-08-04 DIAGNOSIS — L988 Other specified disorders of the skin and subcutaneous tissue: Secondary | ICD-10-CM

## 2020-08-04 DIAGNOSIS — D225 Melanocytic nevi of trunk: Secondary | ICD-10-CM

## 2020-08-04 DIAGNOSIS — D239 Other benign neoplasm of skin, unspecified: Secondary | ICD-10-CM

## 2020-08-04 NOTE — Progress Notes (Addendum)
Follow-Up Visit   Subjective  Katelyn Dennis is a 64 y.o. female who presents for the following: Facial Elastosis (pt presents for fillers to the lips and perioral today) and Dysplastic Nevus Moderate to severe, bx proven (right epigastric area, bx 06/23/2020).  The following portions of the chart were reviewed this encounter and updated as appropriate:  Allergies  Meds  Problems  Med Hx  Surg Hx  Fam Hx     Review of Systems:  No other skin or systemic complaints except as noted in HPI or Assessment and Plan.  Objective  Well appearing patient in no apparent distress; mood and affect are within normal limits.  A focused examination was performed including face. Relevant physical exam findings are noted in the Assessment and Plan.  Objective  upper lip, lower lip, perioral area: Rhytides and volume loss.   Images                            Objective  Right epigastric: Pink bx site 1.1cm   Assessment & Plan  Elastosis of skin upper lip, lower lip, perioral area  Volbella XC 34ml syringe injected to: -Upper Lip vermillion and border -Lower lip vermillion -Perioral area  Lynder Parents Lot # [id number] exp 11/22/2020  Filling material injection - upper lip, lower lip, perioral area Prior to the procedure, the patient's past medical history, allergies and the rare but potential risks and complications were reviewed with the patient and a signed consent was obtained. Pre and post-treatment care was discussed and instructions provided.  Location: lips and perioral  Filler Type: Volbella XC  Procedure: Lidocaine-tetracaine ointment was applied to treatment areas to achieve good local anesthesia. The area was prepped thoroughly with Puracyn. After introducing the needle into the desired treatment area, the syringe plunger was drawn back to ensure there was no flash of blood prior to injecting the filler in order to minimize risk of intravascular  injection and vascular occlusion. After injection of the filler, the treated areas were cleansed and iced to reduce swelling. Post-treatment instructions were reviewed with the patient.       Patient tolerated the procedure well. The patient will call with any problems, questions or concerns prior to their next appointment.   Dysplastic nevus Right epigastric  Moderate to severe bx proven 06/23/2020   Epidermal / dermal shaving - Right epigastric  Lesion diameter (cm):  1.1 Informed consent: discussed and consent obtained   Timeout: patient name, date of birth, surgical site, and procedure verified   Procedure prep:  Patient was prepped and draped in usual sterile fashion Prep type:  Isopropyl alcohol Anesthesia: the lesion was anesthetized in a standard fashion   Anesthetic:  1% lidocaine w/ epinephrine 1-100,000 buffered w/ 8.4% NaHCO3 Instrument used: flexible razor blade   Hemostasis achieved with: pressure, aluminum chloride and electrodesiccation   Outcome: patient tolerated procedure well   Post-procedure details: sterile dressing applied and wound care instructions given   Dressing type: bandage and bacitracin    Specimen 1 - Surgical pathology Differential Diagnosis: D48.5 Dysplastic Nevus Check Margins: yes Pink bx site [phone]  Return in about 6 months (around 02/01/2021) for 6 months f/u dysplastic Nevus, 2+ wks for more filler if pt desires.  I, Othelia Pulling, RMA, am acting as scribe for Sarina Ser, MD .  Documentation: I have reviewed the above documentation for accuracy and completeness, and I agree with the above.  Sarina Ser, MD

## 2020-08-04 NOTE — Patient Instructions (Signed)

## 2020-08-08 ENCOUNTER — Encounter: Payer: Self-pay | Admitting: Dermatology

## 2020-08-10 ENCOUNTER — Telehealth: Payer: Self-pay

## 2020-08-10 NOTE — Telephone Encounter (Signed)
Patient informed of pathology results 

## 2020-08-10 NOTE — Telephone Encounter (Signed)
-----   Message from Ralene Bathe, MD sent at 08/09/2020  6:59 PM EDT ----- Skin , right epigastric NO RESIDUAL DYSPLASTIC NEVUS, MARGINS FREE  Moderate to severe dysplastic Margins clear Recheck next visit

## 2020-09-05 ENCOUNTER — Other Ambulatory Visit: Payer: Self-pay | Admitting: Obstetrics & Gynecology

## 2020-09-05 DIAGNOSIS — N6310 Unspecified lump in the right breast, unspecified quadrant: Secondary | ICD-10-CM

## 2020-09-14 ENCOUNTER — Ambulatory Visit
Admission: RE | Admit: 2020-09-14 | Discharge: 2020-09-14 | Disposition: A | Discharge status: Home / Self Care | Payer: Medicare Other | Source: Ambulatory Visit | Attending: Obstetrics & Gynecology | Admitting: Obstetrics & Gynecology

## 2020-09-14 ENCOUNTER — Other Ambulatory Visit: Payer: Self-pay

## 2020-09-14 DIAGNOSIS — N6310 Unspecified lump in the right breast, unspecified quadrant: Secondary | ICD-10-CM

## 2020-09-14 DIAGNOSIS — N6311 Unspecified lump in the right breast, upper outer quadrant: Secondary | ICD-10-CM | POA: Diagnosis not present

## 2020-09-14 DIAGNOSIS — R922 Inconclusive mammogram: Secondary | ICD-10-CM | POA: Insufficient documentation

## 2020-09-15 ENCOUNTER — Other Ambulatory Visit: Payer: Self-pay | Admitting: Obstetrics & Gynecology

## 2020-09-15 DIAGNOSIS — N6310 Unspecified lump in the right breast, unspecified quadrant: Secondary | ICD-10-CM

## 2021-02-02 ENCOUNTER — Ambulatory Visit: Admitting: Dermatology

## 2021-03-22 ENCOUNTER — Other Ambulatory Visit (HOSPITAL_COMMUNITY): Payer: Self-pay | Admitting: Rheumatology

## 2021-03-22 ENCOUNTER — Other Ambulatory Visit: Payer: Self-pay | Admitting: Rheumatology

## 2021-03-22 DIAGNOSIS — R21 Rash and other nonspecific skin eruption: Secondary | ICD-10-CM

## 2021-03-22 DIAGNOSIS — M341 CR(E)ST syndrome: Secondary | ICD-10-CM

## 2021-03-28 ENCOUNTER — Ambulatory Visit: Payer: TRICARE For Life (TFL)

## 2021-03-30 ENCOUNTER — Ambulatory Visit: Payer: TRICARE For Life (TFL) | Admitting: Dermatology

## 2021-04-04 ENCOUNTER — Other Ambulatory Visit: Payer: Self-pay

## 2021-04-04 ENCOUNTER — Ambulatory Visit
Admission: RE | Admit: 2021-04-04 | Discharge: 2021-04-04 | Disposition: A | Discharge status: Home / Self Care | Payer: Medicare Other | Source: Ambulatory Visit | Attending: Rheumatology | Admitting: Rheumatology

## 2021-04-04 DIAGNOSIS — R21 Rash and other nonspecific skin eruption: Secondary | ICD-10-CM | POA: Diagnosis present

## 2021-04-04 DIAGNOSIS — M341 CR(E)ST syndrome: Secondary | ICD-10-CM | POA: Diagnosis present

## 2021-04-20 ENCOUNTER — Ambulatory Visit (INDEPENDENT_AMBULATORY_CARE_PROVIDER_SITE_OTHER): Payer: Medicare Other | Admitting: Dermatology

## 2021-04-20 ENCOUNTER — Other Ambulatory Visit: Payer: Self-pay

## 2021-04-20 ENCOUNTER — Encounter: Payer: Self-pay | Admitting: Dermatology

## 2021-04-20 DIAGNOSIS — L988 Other specified disorders of the skin and subcutaneous tissue: Secondary | ICD-10-CM

## 2021-04-20 DIAGNOSIS — L720 Epidermal cyst: Secondary | ICD-10-CM

## 2021-04-20 DIAGNOSIS — I781 Nevus, non-neoplastic: Secondary | ICD-10-CM

## 2021-04-20 DIAGNOSIS — Z86018 Personal history of other benign neoplasm: Secondary | ICD-10-CM | POA: Diagnosis not present

## 2021-04-20 NOTE — Progress Notes (Signed)
   Follow-Up Visit   Subjective  Katelyn Dennis is a 65 y.o. female who presents for the following: Facial Elastosis (Face, pt interested in fillers), check spot (L infranasal, >65m), and recheck Dysplastic Nevus (R epigastric, shave removal 08/04/20).  The following portions of the chart were reviewed this encounter and updated as appropriate:   Allergies  Meds  Problems  Med Hx  Surg Hx  Fam Hx     Review of Systems:  No other skin or systemic complaints except as noted in HPI or Assessment and Plan.  Objective  Well appearing patient in no apparent distress; mood and affect are within normal limits.  A focused examination was performed including face, abdomen. Relevant physical exam findings are noted in the Assessment and Plan.  Objective  face: Rhytides and volume loss.   Images                              Objective  Head - Anterior (Face): Dilated vessels face  Objective  L sup nasolabial x 1: White pap  Objective  R epigastric: Scar with no evidence of recurrence.    Assessment & Plan  Elastosis of skin face Volbella XC 1cc injected today to: - upper lip vermillion and vermilion border - lower lip vermillion and vermilion border - perioral rhytides and oral commissures and marionette lines  Lot# [id number] exp 06/26/2022  Filling material injection - face Prior to the procedure, the patient's past medical history, allergies and the rare but potential risks and complications were reviewed with the patient and a signed consent was obtained. Pre and post-treatment care was discussed and instructions provided.  Location: lips and perioral  Filler Type: Juvederm Volbella  Procedure: Lidocaine-tetracaine ointment was applied to treatment areas to achieve good local anesthesia. The area was prepped thoroughly with Puracyn. After introducing the needle into the desired treatment area, the syringe plunger was drawn back to ensure there  was no flash of blood prior to injecting the filler in order to minimize risk of intravascular injection and vascular occlusion.    Patient tolerated the procedure well. The patient will call with any problems, questions or concerns prior to their next appointment.   Telangiectasia Head - Anterior (Face) Benign, Discussed BBL, $350 per treatment, several treatments for best results.  Milia L sup nasolabial x 1 Acne/Milia surgery - L sup nasolabial x 1 Procedure risks and benefits were discussed with the patient and verbal consent was obtained. Following prep of the skin on the L infra nasal with an alcohol swab, extraction of comedones was performed with a comedone extractor. Vaseline ointment was applied to each site. The patient tolerated the procedure well.  History of dysplastic nevus R epigastric Clear. Observe for recurrence. Call clinic for new or changing lesions.  Recommend regular skin exams, daily broad-spectrum spf 30+ sunscreen use, and photoprotection.     Return in about 3 months (around 07/21/2021) for TBSE, Hx of Dysplastic nevi.  I, Othelia Pulling, RMA, am acting as scribe for Sarina Ser, MD .  Documentation: I have reviewed the above documentation for accuracy and completeness, and I agree with the above.  Sarina Ser, MD

## 2021-04-20 NOTE — Patient Instructions (Signed)

## 2021-04-21 ENCOUNTER — Encounter: Payer: Self-pay | Admitting: Dermatology

## 2021-06-20 DIAGNOSIS — K449 Diaphragmatic hernia without obstruction or gangrene: Secondary | ICD-10-CM

## 2021-06-20 DIAGNOSIS — R194 Change in bowel habit: Secondary | ICD-10-CM | POA: Insufficient documentation

## 2021-06-20 DIAGNOSIS — R933 Abnormal findings on diagnostic imaging of other parts of digestive tract: Secondary | ICD-10-CM

## 2021-06-20 DIAGNOSIS — Z860100 Personal history of colon polyps, unspecified: Secondary | ICD-10-CM | POA: Insufficient documentation

## 2021-06-20 DIAGNOSIS — K222 Esophageal obstruction: Secondary | ICD-10-CM | POA: Insufficient documentation

## 2021-06-20 DIAGNOSIS — R1319 Other dysphagia: Secondary | ICD-10-CM | POA: Insufficient documentation

## 2021-06-20 HISTORY — DX: Abnormal findings on diagnostic imaging of other parts of digestive tract: R93.3

## 2021-06-20 HISTORY — DX: Diaphragmatic hernia without obstruction or gangrene: K44.9

## 2021-06-20 HISTORY — DX: Other dysphagia: R13.19

## 2021-06-20 HISTORY — DX: Esophageal obstruction: K22.2

## 2021-06-20 HISTORY — DX: Personal history of colon polyps, unspecified: Z86.0100

## 2021-06-20 HISTORY — DX: Personal history of colonic polyps: Z86.010

## 2021-07-27 ENCOUNTER — Other Ambulatory Visit: Payer: Self-pay

## 2021-07-27 ENCOUNTER — Ambulatory Visit (INDEPENDENT_AMBULATORY_CARE_PROVIDER_SITE_OTHER): Payer: Medicare Other | Admitting: Dermatology

## 2021-07-27 DIAGNOSIS — Z86018 Personal history of other benign neoplasm: Secondary | ICD-10-CM | POA: Diagnosis not present

## 2021-07-27 DIAGNOSIS — Z1283 Encounter for screening for malignant neoplasm of skin: Secondary | ICD-10-CM

## 2021-07-27 DIAGNOSIS — D225 Melanocytic nevi of trunk: Secondary | ICD-10-CM | POA: Diagnosis not present

## 2021-07-27 DIAGNOSIS — L814 Other melanin hyperpigmentation: Secondary | ICD-10-CM

## 2021-07-27 DIAGNOSIS — L821 Other seborrheic keratosis: Secondary | ICD-10-CM

## 2021-07-27 DIAGNOSIS — D229 Melanocytic nevi, unspecified: Secondary | ICD-10-CM

## 2021-07-27 DIAGNOSIS — L578 Other skin changes due to chronic exposure to nonionizing radiation: Secondary | ICD-10-CM | POA: Diagnosis not present

## 2021-07-27 DIAGNOSIS — D1800 Hemangioma unspecified site: Secondary | ICD-10-CM

## 2021-07-27 NOTE — Progress Notes (Signed)
   Follow-Up Visit   Subjective  Katelyn Dennis is a 65 y.o. female who presents for the following: Annual Exam (Mole check ). The patient presents for Total-Body Skin Exam (TBSE) for skin cancer screening and mole check.   The following portions of the chart were reviewed this encounter and updated as appropriate:   Allergies  Meds  Problems  Med Hx  Surg Hx  Fam Hx     Review of Systems:  No other skin or systemic complaints except as noted in HPI or Assessment and Plan.  Objective  Well appearing patient in no apparent distress; mood and affect are within normal limits.  A full examination was performed including scalp, head, eyes, ears, nose, lips, neck, chest, axillae, abdomen, back, buttocks, bilateral upper extremities, bilateral lower extremities, hands, feet, fingers, toes, fingernails, and toenails. All findings within normal limits unless otherwise noted below.  right upper chest 0.5 x 0.4 cm slightly irregular brown macule         Assessment & Plan  Nevus right upper chest  Observation.  Call clinic for new or changing moles.  Recommend daily use of broad spectrum spf 30+ sunscreen to sun-exposed areas.    Skin cancer screening  Lentigines - Scattered tan macules - Due to sun exposure - Benign-appering, observe - Recommend daily broad spectrum sunscreen SPF 30+ to sun-exposed areas, reapply every 2 hours as needed. - Call for any changes  Seborrheic Keratoses - Stuck-on, waxy, tan-brown papules and/or plaques  - Benign-appearing - Discussed benign etiology and prognosis. - Observe - Call for any changes  Melanocytic Nevi - Tan-brown and/or pink-flesh-colored symmetric macules and papules - Benign appearing on exam today - Observation - Call clinic for new or changing moles - Recommend daily use of broad spectrum spf 30+ sunscreen to sun-exposed areas.   Hemangiomas - Red papules - Discussed benign nature - Observe - Call for any  changes  Actinic Damage - Chronic condition, secondary to cumulative UV/sun exposure - diffuse scaly erythematous macules with underlying dyspigmentation - Recommend daily broad spectrum sunscreen SPF 30+ to sun-exposed areas, reapply every 2 hours as needed.  - Staying in the shade or wearing long sleeves, sun glasses (UVA+UVB protection) and wide brim hats (4-inch brim around the entire circumference of the hat) are also recommended for sun protection.  - Call for new or changing lesions.  History of Dysplastic Nevus Right  epigastric 08/04/2020 - No evidence of recurrence today - Recommend regular full body skin exams - Recommend daily broad spectrum sunscreen SPF 30+ to sun-exposed areas, reapply every 2 hours as needed.  - Call if any new or changing lesions are noted between office visits   Skin cancer screening performed today.   Return in about 1 year (around 07/27/2022) for TBSE, hx of Dysplastic nevus .  IMarye Round, CMA, am acting as scribe for Sarina Ser, MD .  Documentation: I have reviewed the above documentation for accuracy and completeness, and I agree with the above.  Sarina Ser, MD

## 2021-07-27 NOTE — Patient Instructions (Signed)

## 2021-08-01 ENCOUNTER — Encounter: Payer: Self-pay | Admitting: Dermatology

## 2021-10-24 ENCOUNTER — Telehealth: Payer: Self-pay

## 2021-10-24 MED ORDER — VALACYCLOVIR HCL 500 MG PO TABS: ORAL_TABLET | ORAL | 0 refills | Status: DC

## 2021-10-24 NOTE — Telephone Encounter (Signed)
Discussed prophylactic Valtrex 500mg  1 tab po BID x 7 days starting one day prior to procedure. Patient states that she doesn't have a history of fever blisters and I advised her I would send it in, and if she wants to fill it she can.

## 2021-10-25 ENCOUNTER — Other Ambulatory Visit: Payer: Self-pay

## 2021-10-25 ENCOUNTER — Ambulatory Visit (INDEPENDENT_AMBULATORY_CARE_PROVIDER_SITE_OTHER): Payer: Self-pay | Admitting: Dermatology

## 2021-10-25 DIAGNOSIS — L988 Other specified disorders of the skin and subcutaneous tissue: Secondary | ICD-10-CM

## 2021-10-25 DIAGNOSIS — L578 Other skin changes due to chronic exposure to nonionizing radiation: Secondary | ICD-10-CM

## 2021-10-25 DIAGNOSIS — L814 Other melanin hyperpigmentation: Secondary | ICD-10-CM

## 2021-10-25 DIAGNOSIS — I781 Nevus, non-neoplastic: Secondary | ICD-10-CM

## 2021-10-25 NOTE — Progress Notes (Addendum)
Follow-Up Visit   Subjective  Katelyn Dennis is a 65 y.o. 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  The following portions of the chart were reviewed this encounter and updated as appropriate:   Allergies  Meds  Problems  Med Hx  Surg Hx  Fam Hx     Review of Systems:  No other skin or systemic complaints except as noted in HPI or Assessment and Plan.  Objective  Well appearing patient in no apparent distress; mood and affect are within normal limits.  A focused examination was performed including the face. Relevant physical exam findings are noted in the Assessment and Plan.  Face Dilated vessels of the face.                 Lips Rhytides and volume loss.                                            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 - BBL performed today    [place] BBL - 10/25/21 1500      Patient Details   Skin Type: III    Anesthestic Cream Applied: No    Photo Takes: Yes    Consent Signed: Yes      Treatment Details   Date: 10/25/21    Treatment #: 1    Area: face    Filter: 1st Pass;2nd Pass      1st Pass - Telangiectasias  Location: F    BBL j/cm2: 30    PW Msec Sec: 25    Cooling Temp: 20    Pulses: 36      2nd Pass - Actinic damage, lentigos  Location: F    BBL j/cm2: 9    PW Msec Sec: 20    Cooling Temp: 22    Pulses: 115     Photorejuvenation - Face  Everybody in treatment room wore eye protection during procedure today     We will take 3 treatments for best results    Photorejuvenation - face Prior to the procedure, the patient's past medical history, medications, allergies, and the rare but potential risks and complications were reviewed with the patient and a signed consent was  obtained.  Pre and post treatment care was discussed and instructions provided.   Patient tolerated the procedure well.    Nancy Fetter avoidance was stressed. The patient will call with any problems, questions or concerns prior to their next appointment.   Elastosis of skin Lips Discussed a face lift or mid fillers to help with jowl appearance. Advised patient that she would need numerous syringes to see significant results.   Voluma 37mL injected into the B/L mid face.   Volbella 1.0 mL injected into the upper and lower lip vermillion; perioral, marionette lines, and chin   Filling material injection - Lips Prior to the procedure, the patient's past medical history, allergies and the rare but potential risks and complications were reviewed with the patient and a signed consent was obtained. Pre and post-treatment care was discussed and instructions provided.  Location: lips, perioral, marionette lines, and chin  Filler Type: Juvederm Volbella 1 cc  Procedure: Lidocaine-tetracaine ointment was applied to treatment areas  to achieve good local anesthesia. The area was prepped thoroughly with Puracyn. After introducing the needle into the desired treatment area, the syringe plunger was drawn back to ensure there was no flash of blood prior to injecting the filler in order to minimize risk of intravascular injection and vascular occlusion. After injection of the filler, the treated areas were cleansed and iced to reduce swelling. Post-treatment instructions were reviewed with the patient.       Patient tolerated the procedure well. The patient will call with any problems, questions or concerns prior to their next appointment.  Lot number: [id number] Expiration date: 06/26/2022  Filling material injection - Lips Prior to the procedure, the patient's past medical history, allergies and the rare but potential risks and complications were reviewed with the patient and a signed consent was obtained. Pre  and post-treatment care was discussed and instructions provided.  Location: cheeks / Mid face  Filler Type: Juvederm Voluma  Procedure: The area was prepped thoroughly with Puracyn. After introducing the needle into the desired treatment area, the syringe plunger was drawn back to ensure there was no flash of blood prior to injecting the filler in order to minimize risk of intravascular injection and vascular occlusion. After injection of the filler, the treated areas were cleansed and iced to reduce swelling. Post-treatment instructions were reviewed with the patient.       Patient tolerated the procedure well. The patient will call with any problems, questions or concerns prior to their next appointment.  Lot number: [id number] Expiration date: 09/15/22  Return in about 6 months (around 04/25/2022) for fillers.  Luther Redo, CMA, am acting as scribe for Sarina Ser, MD . Documentation: I have reviewed the above documentation for accuracy and completeness, and I agree with the above.  Sarina Ser, MD

## 2021-10-25 NOTE — Patient Instructions (Signed)

## 2021-11-03 ENCOUNTER — Encounter: Payer: Self-pay | Admitting: Dermatology

## 2021-11-23 ENCOUNTER — Other Ambulatory Visit: Payer: Self-pay | Admitting: Obstetrics and Gynecology

## 2021-11-23 DIAGNOSIS — N6311 Unspecified lump in the right breast, upper outer quadrant: Secondary | ICD-10-CM

## 2021-12-20 ENCOUNTER — Ambulatory Visit
Admission: RE | Admit: 2021-12-20 | Discharge: 2021-12-20 | Disposition: A | Discharge status: Home / Self Care | Payer: Medicare Other | Source: Ambulatory Visit | Attending: Obstetrics and Gynecology | Admitting: Obstetrics and Gynecology

## 2021-12-20 ENCOUNTER — Other Ambulatory Visit: Payer: Self-pay

## 2021-12-20 DIAGNOSIS — N6311 Unspecified lump in the right breast, upper outer quadrant: Secondary | ICD-10-CM

## 2021-12-22 ENCOUNTER — Other Ambulatory Visit: Payer: Self-pay | Admitting: Obstetrics and Gynecology

## 2021-12-22 DIAGNOSIS — N63 Unspecified lump in unspecified breast: Secondary | ICD-10-CM

## 2022-02-07 ENCOUNTER — Other Ambulatory Visit: Payer: Self-pay

## 2022-02-07 ENCOUNTER — Ambulatory Visit (INDEPENDENT_AMBULATORY_CARE_PROVIDER_SITE_OTHER): Payer: Self-pay | Admitting: Dermatology

## 2022-02-07 DIAGNOSIS — L988 Other specified disorders of the skin and subcutaneous tissue: Secondary | ICD-10-CM

## 2022-02-07 NOTE — Progress Notes (Signed)
? ?  Follow-Up Visit ?  ?Subjective  ?Katelyn Dennis is a 66 y.o. female who presents for the following: Follow-up (Patient here today for filler at lips and around mouth. ). ? ?The following portions of the chart were reviewed this encounter and updated as appropriate:  Allergies  Meds  Problems  Med Hx  Surg Hx  Fam Hx   ?  ?Review of Systems: No other skin or systemic complaints except as noted in HPI or Assessment and Plan. ? ?Objective  ?Well appearing patient in no apparent distress; mood and affect are within normal limits. ? ?A focused examination was performed including face . Relevant physical exam findings are noted in the Assessment and Plan. ? ?face ?Rhytides and volume loss. ? ? ? ? ? ? ? ? ? ? ? ? ? ? ? ? ? ? ? ? ? ? ? ? ? ? ? ? ? ? ?Assessment & Plan  ?Elastosis of skin ?face ? ?4 units of botox injected at upper lip ?1 ml of Restylane Refyne injected in chin and oral commissures ?1 cc Volbella XC injected to upper and lower lips and corners of mouth ? ?Volbella XC = Lips - upper and lower lips vermillion borders and upper and lower lips vermillion ?Lot [id number] ?Exp 2023-04-03 ? ?Restylane Refyne = Perioral - oral commissures; melolabial folds; anterior chin crease; lateral chin  ?Lot [id number] ?Exp 2023-01-16 ? ?Filling material injection - face ?Prior to the procedure, the patient's past medical history, allergies and the rare but potential risks and complications were reviewed with the patient and a signed consent was obtained. Pre and post-treatment care was discussed and instructions provided. ? ?Location: upper and lower lips, oral commissures, corners of mouth, chin ? ?Filler Type: Restylane refyne and Volbella  ? ?Procedure: Lidocaine-tetracaine ointment was applied to treatment areas to achieve good local anesthesia. The area was prepped thoroughly with Puracyn. After introducing the needle into the desired treatment area, the syringe plunger was drawn back to ensure there was no flash of  blood prior to injecting the filler in order to minimize risk of intravascular injection and vascular occlusion. After injection of the filler, the treated areas were cleansed and iced to reduce swelling. Post-treatment instructions were reviewed with the patient.      ? ?Patient tolerated the procedure well. The patient will call with any problems, questions or concerns prior to their next appointment. ? ?Volbella XC  ?Lot [id number] ?Exp 2023-04-03 ? ?Restylane Refyne  ?Lot [id number] ?Exp 2023-01-16 ? ?Return if symptoms worsen or fail to improve, for 2 month filler and botox follow up. ?I, Ruthell Rummage, CMA, am acting as scribe for Sarina Ser, MD. ? ?Documentation: I have reviewed the above documentation for accuracy and completeness, and I agree with the above. ? ?Sarina Ser, MD ? ?

## 2022-02-07 NOTE — Patient Instructions (Signed)

## 2022-02-11 ENCOUNTER — Encounter: Payer: Self-pay | Admitting: Dermatology

## 2022-02-28 ENCOUNTER — Ambulatory Visit: Payer: TRICARE For Life (TFL) | Admitting: Dermatology

## 2022-04-12 ENCOUNTER — Ambulatory Visit: Payer: Medicare Other | Admitting: Urology

## 2022-04-13 ENCOUNTER — Ambulatory Visit (INDEPENDENT_AMBULATORY_CARE_PROVIDER_SITE_OTHER): Payer: Medicare Other | Admitting: Urology

## 2022-04-13 ENCOUNTER — Encounter: Payer: Self-pay | Admitting: Urology

## 2022-04-13 VITALS — BP 119/72 | HR 79 | Ht 60.0 in | Wt 117.0 lb

## 2022-04-13 DIAGNOSIS — R350 Frequency of micturition: Secondary | ICD-10-CM | POA: Diagnosis not present

## 2022-04-13 DIAGNOSIS — R3915 Urgency of urination: Secondary | ICD-10-CM | POA: Diagnosis not present

## 2022-04-13 DIAGNOSIS — N390 Urinary tract infection, site not specified: Secondary | ICD-10-CM

## 2022-04-13 DIAGNOSIS — N3281 Overactive bladder: Secondary | ICD-10-CM

## 2022-04-13 DIAGNOSIS — R351 Nocturia: Secondary | ICD-10-CM | POA: Diagnosis not present

## 2022-04-13 DIAGNOSIS — Z87440 Personal history of urinary (tract) infections: Secondary | ICD-10-CM

## 2022-04-13 LAB — URINALYSIS, COMPLETE
Bilirubin, UA: NEGATIVE
Glucose, UA: NEGATIVE
Ketones, UA: NEGATIVE
Leukocytes,UA: NEGATIVE
Nitrite, UA: NEGATIVE
Protein,UA: NEGATIVE
Specific Gravity, UA: 1.02 (ref 1.005–1.030)
Urobilinogen, Ur: 0.2 mg/dL (ref 0.2–1.0)
pH, UA: 5.5 (ref 5.0–7.5)

## 2022-04-13 LAB — MICROSCOPIC EXAMINATION

## 2022-04-13 LAB — BLADDER SCAN AMB NON-IMAGING

## 2022-04-13 MED ORDER — GEMTESA 75 MG PO TABS: 75.0000 mg | ORAL_TABLET | Freq: Every day | ORAL | 0 refills | Status: DC

## 2022-04-13 NOTE — Progress Notes (Signed)
04/13/2022 11:59 AM   Katelyn Dennis [birth year] [id number]  Referring provider: Baxter Hire, MD [place],  [place] [address]  Chief Complaint  Patient presents with   Urinary Tract Infection    HPI: Katelyn Dennis is a 66 y.o. female referred for evaluation of recurrent UTIs.  Treated for symptomatic UTIs January 2023 May 2023 Urine cultures were positive for E. coli and staph epidermis Has been on hormone replacement therapy for many years Has baseline overactive bladder symptoms including urinary frequency, urgency and nocturia x2-3 The symptoms are significantly worse when she has a symptomatic infection Presently on tolterodine ER with mild improvement in symptoms Was prescribed Myrbetriq however this was not covered by her insurance company. Denies gross hematuria.  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   Esophageal dysphagia 06/20/2021   Gastroesophageal reflux disease without esophagitis 02/09/2019   Hiatal hernia 06/20/2021   History of colonic polyps 06/20/2021   Hx of dysplastic nevus 06/23/2020   R epigastric moderate to severe, shave removal 08/04/20   Schatzki's ring 06/20/2021    Surgical History: Past Surgical History:  Procedure Laterality Date   BREAST CYST EXCISION Right late 90s   axilla area- fibroadenoma   BREAST CYST EXCISION Left 2015   same as right   BREAST EXCISIONAL BIOPSY Bilateral    "fibroadenomas" per pt- years ago   KNEE ARTHROSCOPY Right    TONSILLECTOMY      Home Medications:  Allergies as of 04/13/2022       Reactions   Minocycline Nausea And Vomiting   Iodine Rash        Medication List        Accurate as of Apr 13, 2022 11:59 AM. If you have any questions, ask your  nurse or doctor.          STOP taking these medications    METRONIDAZOLE (TOPICAL) 0.75 % Lotn Stopped by: Katelyn Sons, MD   omeprazole 20 MG capsule Commonly known as: PRILOSEC Stopped by: Katelyn Sons, MD   valACYclovir 500 MG tablet Commonly known as: VALTREX Stopped by: Katelyn Sons, MD       TAKE these medications    amLODipine 2.5 MG tablet Commonly known as: NORVASC Take 1 tablet by mouth daily. What changed: Another medication with the same name was removed. Continue taking this medication, and follow the directions you see here. Changed by: Katelyn Sons, MD   estradiol 2 MG tablet Commonly known as: ESTRACE Take 2 mg by mouth daily.   pantoprazole 40 MG tablet Commonly known as: PROTONIX Take 1 tablet by mouth daily. What changed: Another medication with the same name was removed. Continue taking this medication, and follow the directions you see here. Changed by: Katelyn Sons, MD   progesterone 100 MG capsule Commonly known as: PROMETRIUM Take 100 mg by mouth at bedtime.   tolterodine 4 MG 24 hr capsule Commonly known as: DETROL LA Take 4 mg by mouth daily.        Allergies:  Allergies  Allergen Reactions   Minocycline Nausea And Vomiting   Iodine Rash    Family History: Family History  Problem Relation Age of Onset   Bladder Cancer Neg Hx    Kidney cancer Neg Hx  Prostate cancer Neg Hx     Social History:  reports that she has never smoked. She has never used smokeless tobacco. She reports that she does not use drugs. No history on file for alcohol use.   Physical Exam: BP 119/72   Pulse 79   Ht 5' (1.524 m)   Wt 117 lb (53.1 kg)   BMI 22.85 kg/m   Constitutional:  Alert and oriented, No acute distress. HEENT: [place] AT, moist mucus membranes.  Trachea midline, no masses. Cardiovascular: No clubbing, cyanosis, or edema. Respiratory: Normal respiratory effort, no increased work of breathing. Skin: No rashes,  bruises or suspicious lesions. Neurologic: Grossly intact, no focal deficits, moving all 4 extremities. Psychiatric: Normal mood and affect.  Laboratory Data:  Urinalysis Dipstick/microscopy negative   Assessment & Plan:    1. Recurrent UTI She has had 2 culture documented infections in the last 6 months meeting the AUA definition for recurrent UTI We discussed the evaluation and treatment of patients with recurrent UTIs at length. Possible etiologies of recurrent infection include periurethral tissue atrophy in postmenopausal woman, constipation, sexual activity, incomplete emptying, anatomic abnormalities, and even genetic predisposition.  Finally, we discussed the role of perineal hygiene, timed voiding, adequate hydration, topical vaginal estrogen, cranberry prophylaxis, and low-dose antibiotic prophylaxis. Urinalysis today was clear Initially recommended starting cranberry or D-mannose Instructed to call our office for symptomatic UTI  2.  Overactive bladder Trial of Gemtesa 75 mg daily Samples given and she will call back regarding efficacy   Katelyn Sons, MD  [facility] [address] [place], [place] [address] [phone]

## 2022-04-15 ENCOUNTER — Encounter: Payer: Self-pay | Admitting: Urology

## 2022-06-20 ENCOUNTER — Other Ambulatory Visit: Payer: TRICARE For Life (TFL)

## 2022-06-20 ENCOUNTER — Inpatient Hospital Stay: Admission: RE | Admit: 2022-06-20 | Payer: TRICARE For Life (TFL) | Source: Ambulatory Visit

## 2022-08-02 ENCOUNTER — Ambulatory Visit (INDEPENDENT_AMBULATORY_CARE_PROVIDER_SITE_OTHER): Payer: Medicare Other | Admitting: Dermatology

## 2022-08-02 DIAGNOSIS — L578 Other skin changes due to chronic exposure to nonionizing radiation: Secondary | ICD-10-CM

## 2022-08-02 DIAGNOSIS — Z86018 Personal history of other benign neoplasm: Secondary | ICD-10-CM

## 2022-08-02 DIAGNOSIS — D2271 Melanocytic nevi of right lower limb, including hip: Secondary | ICD-10-CM

## 2022-08-02 DIAGNOSIS — Z1283 Encounter for screening for malignant neoplasm of skin: Secondary | ICD-10-CM | POA: Diagnosis not present

## 2022-08-02 DIAGNOSIS — L720 Epidermal cyst: Secondary | ICD-10-CM

## 2022-08-02 DIAGNOSIS — D2272 Melanocytic nevi of left lower limb, including hip: Secondary | ICD-10-CM

## 2022-08-02 DIAGNOSIS — L820 Inflamed seborrheic keratosis: Secondary | ICD-10-CM | POA: Diagnosis not present

## 2022-08-02 DIAGNOSIS — I781 Nevus, non-neoplastic: Secondary | ICD-10-CM | POA: Diagnosis not present

## 2022-08-02 DIAGNOSIS — D229 Melanocytic nevi, unspecified: Secondary | ICD-10-CM

## 2022-08-02 DIAGNOSIS — L821 Other seborrheic keratosis: Secondary | ICD-10-CM

## 2022-08-02 DIAGNOSIS — L814 Other melanin hyperpigmentation: Secondary | ICD-10-CM

## 2022-08-02 DIAGNOSIS — D1801 Hemangioma of skin and subcutaneous tissue: Secondary | ICD-10-CM

## 2022-08-02 DIAGNOSIS — D239 Other benign neoplasm of skin, unspecified: Secondary | ICD-10-CM

## 2022-08-02 DIAGNOSIS — D225 Melanocytic nevi of trunk: Secondary | ICD-10-CM

## 2022-08-02 NOTE — Patient Instructions (Addendum)
Aklief cream, dot treat milia on left upper lip and chin nightly  Cryotherapy Aftercare  Wash gently with soap and water everyday.   Apply Vaseline and Band-Aid daily until healed.   Due to recent changes in healthcare laws, you may see results of your pathology and/or laboratory studies on MyChart before the doctors have had a chance to review them. We understand that in some cases there may be results that are confusing or concerning to you. Please understand that not all results are received at the same time and often the doctors may need to interpret multiple results in order to provide you with the best plan of care or course of treatment. Therefore, we ask that you please give [place] 2 business days to thoroughly review all your results before contacting the office for clarification. Should we see a critical lab result, you will be contacted sooner.   If You Need Anything After Your Visit  If you have any questions or concerns for your doctor, please call our main line at [phone] and press option 4 to reach your doctor's medical assistant. If no one answers, please leave a voicemail as directed and we will return your call as soon as possible. Messages left after 4 pm will be answered the following business day.   You may also send [place] a message via [place]. We typically respond to MyChart messages within 1-2 business days.  For prescription refills, please ask your pharmacy to contact our office. Our fax number is [phone].  If you have an urgent issue when the clinic is closed that cannot wait until the next business day, you can page your doctor at the number below.    Please note that while we do our best to be available for urgent issues outside of office hours, we are not available 24/7.   If you have an urgent issue and are unable to reach [place], you may choose to seek medical care at your doctor's office, retail clinic, urgent care center, or emergency room.  If you have a  medical emergency, please immediately call 911 or go to the emergency department.  Pager Numbers  - Dr. Nehemiah Massed: [phone]  - Dr. Laurence Ferrari: [phone]  - Dr. Nicole Kindred: [phone]  In the event of inclement weather, please call our main line at [phone] for an update on the status of any delays or closures.  Dermatology Medication Tips: Please keep the boxes that topical medications come in in order to help keep track of the instructions about where and how to use these. Pharmacies typically print the medication instructions only on the boxes and not directly on the medication tubes.   If your medication is too expensive, please contact our office at [phone] option 4 or send [place] a message through [place].   We are unable to tell what your co-pay for medications will be in advance as this is different depending on your insurance coverage. However, we may be able to find a substitute medication at lower cost or fill out paperwork to get insurance to cover a needed medication.   If a prior authorization is required to get your medication covered by your insurance company, please allow [place] 1-2 business days to complete this process.  Drug prices often vary depending on where the prescription is filled and some pharmacies may offer cheaper prices.  The website www.goodrx.com contains coupons for medications through different pharmacies. The prices here do not account for what the cost may be with help from insurance (  it may be cheaper with your insurance), but the website can give you the price if you did not use any insurance.  - You can print the associated coupon and take it with your prescription to the pharmacy.  - You may also stop by our office during regular business hours and pick up a GoodRx coupon card.  - If you need your prescription sent electronically to a different pharmacy, notify our office through [facility] or by phone at [phone] option 4.     Si  Usted Necesita Algo Despus de Su Visita  Tambin puede enviarnos un mensaje a travs de Pharmacist, community. Por lo general respondemos a los mensajes de MyChart en el transcurso de 1 a 2 das hbiles.  Para renovar recetas, por favor pida a su farmacia que se ponga en contacto con nuestra oficina. Harland Dingwall de fax es [place] [phone].  Si tiene un asunto urgente cuando la clnica est cerrada y que no puede esperar hasta el siguiente da hbil, puede llamar/localizar a su doctor(a) al nmero que aparece a continuacin.   Por favor, tenga en cuenta que aunque hacemos todo lo posible para estar disponibles para asuntos urgentes fuera del horario de [place], no estamos disponibles las 24 horas del da, los 7 das de la [place].   Si tiene un problema urgente y no puede comunicarse con nosotros, puede optar por buscar atencin mdica  en el consultorio de su doctor(a), en una clnica privada, en un centro de atencin urgente o en una sala de emergencias.  Si tiene Engineering geologist, por favor llame inmediatamente al 911 o vaya a la sala de emergencias.  Nmeros de bper  - Dr. Nehemiah Massed: [phone]  - Dra. Moye: [phone]  - Dra. Nicole Kindred: [phone]  En caso de inclemencias del [place], por favor llame a Johnsie Kindred principal al [phone] para una actualizacin sobre el [place] de cualquier retraso o cierre.  Consejos para la medicacin en dermatologa: Por favor, guarde las cajas en las que vienen los medicamentos de uso tpico para ayudarle a seguir las instrucciones sobre dnde y cmo usarlos. Las farmacias generalmente imprimen las instrucciones del medicamento slo en las cajas y no directamente en los tubos del [place].   Si su medicamento es muy caro, por favor, pngase en contacto con Zigmund Daniel llamando al [phone] y presione la opcin 4 o envenos un mensaje a travs de Pharmacist, community.   No podemos decirle cul ser su copago por los medicamentos por adelantado ya que  esto es diferente dependiendo de la cobertura de su seguro. Sin embargo, es posible que podamos encontrar un medicamento sustituto a Electrical engineer un formulario para que el seguro cubra el medicamento que se considera necesario.   Si se requiere una autorizacin previa para que su compaa de seguros [place] su medicamento, por favor permtanos de 1 a 2 das hbiles para completar este proceso.  Los precios de los medicamentos varan con frecuencia dependiendo del Environmental consultant de dnde se surte la receta y alguna farmacias pueden ofrecer precios ms baratos.  El sitio web www.goodrx.com tiene cupones para medicamentos de Airline pilot. Los precios aqu no tienen en cuenta lo que podra costar con la ayuda del seguro (puede ser ms barato con su seguro), pero el sitio web puede darle el precio si no utiliz Research scientist (physical sciences).  - Puede imprimir el cupn correspondiente y llevarlo con su receta a la farmacia.  - Tambin puede pasar por nuestra oficina durante el horario de atencin  regular y recoger una tarjeta de cupones de GoodRx.  - Si necesita que su receta se enve electrnicamente a una farmacia diferente, informe a nuestra oficina a travs de MyChart de [place] o por telfono llamando al [phone] y presione la opcin 4.

## 2022-08-02 NOTE — Progress Notes (Unsigned)
Follow-Up Visit   Subjective  Katelyn Dennis is a 67 y.o. female who presents for the following: Total body skin exam (Hx of Dysplastic Nevus R epigastric) and Milia (Face, pt would like extracted). The patient presents for Total-Body Skin Exam (TBSE) for skin cancer screening and mole check.  The patient has spots, moles and lesions to be evaluated, some may be new or changing and the patient has concerns that these could be cancer.   The following portions of the chart were reviewed this encounter and updated as appropriate:   Tobacco  Allergies  Meds  Problems  Med Hx  Surg Hx  Fam Hx     Review of Systems:  No other skin or systemic complaints except as noted in HPI or Assessment and Plan.  Objective  Well appearing patient in no apparent distress; mood and affect are within normal limits.  A full examination was performed including scalp, head, eyes, ears, nose, lips, neck, chest, axillae, abdomen, back, buttocks, bilateral upper extremities, bilateral lower extremities, hands, feet, fingers, toes, fingernails, and toenails. All findings within normal limits unless otherwise noted below.  L upper lip, chin Smooth white papule(s).   Mid Back x 4 (4) Stuck on waxy paps with erythema  bil lower legs Telangiectasias bil lower legs   Assessment & Plan   Lentigines - Scattered tan macules - Due to sun exposure - Benign-appearing, observe - Recommend daily broad spectrum sunscreen SPF 30+ to sun-exposed areas, reapply every 2 hours as needed. - Call for any changes - back  Seborrheic Keratoses - Stuck-on, waxy, tan-brown papules and/or plaques  - Benign-appearing - Discussed benign etiology and prognosis. - Observe - Call for any changes - back, chest, arms  Melanocytic Nevi - Tan-brown and/or pink-flesh-colored symmetric macules and papules - Benign appearing on exam today - Observation - Call clinic for new or changing moles - Recommend daily use of broad  spectrum spf 30+ sunscreen to sun-exposed areas.  - trunk, legs  Hemangiomas - Red papules - Discussed benign nature - Observe - Call for any changes - trunk  Actinic Damage - Chronic condition, secondary to cumulative UV/sun exposure - diffuse scaly erythematous macules with underlying dyspigmentation - Recommend daily broad spectrum sunscreen SPF 30+ to sun-exposed areas, reapply every 2 hours as needed.  - Staying in the shade or wearing long sleeves, sun glasses (UVA+UVB protection) and wide brim hats (4-inch brim around the entire circumference of the hat) are also recommended for sun protection.  - Call for new or changing lesions.  Skin cancer screening performed today.   Milia L upper lip, chin Chronic and persistent condition with duration or expected duration over one year. Condition is symptomatic / bothersome to patient. Not to goal. Start Aklief cr qhs to aa face, samples x 2 Lot [id number] 11/2023  Inflamed seborrheic keratosis (4) Mid Back x 4 Symptomatic, irritating, patient would like treated. Destruction of lesion - Mid Back x 4 Complexity: simple   Destruction method: cryotherapy   Informed consent: discussed and consent obtained   Timeout:  patient name, date of birth, surgical site, and procedure verified Lesion destroyed using liquid nitrogen: Yes   Region frozen until ice ball extended beyond lesion: Yes   Outcome: patient tolerated procedure well with no complications   Post-procedure details: wound care instructions given    Spider veins bil lower legs Benign, observe.    History of Dysplastic Nevi - No evidence of recurrence today - Recommend regular full body  skin exams - Recommend daily broad spectrum sunscreen SPF 30+ to sun-exposed areas, reapply every 2 hours as needed.  - Call if any new or changing lesions are noted between office visits  - R epigastric  Return in about 1 year (around 08/03/2023) for TBSE, Hx of Dysplastic nevi.  I,  Othelia Pulling, RMA, am acting as scribe for Sarina Ser, MD . Documentation: I have reviewed the above documentation for accuracy and completeness, and I agree with the above.  Sarina Ser, MD

## 2022-08-05 ENCOUNTER — Encounter: Payer: Self-pay | Admitting: Dermatology

## 2022-08-27 ENCOUNTER — Ambulatory Visit
Admission: RE | Admit: 2022-08-27 | Discharge: 2022-08-27 | Disposition: A | Discharge status: Home / Self Care | Payer: Medicare Other | Source: Ambulatory Visit | Attending: Obstetrics and Gynecology | Admitting: Obstetrics and Gynecology

## 2022-08-27 DIAGNOSIS — N6311 Unspecified lump in the right breast, upper outer quadrant: Secondary | ICD-10-CM | POA: Diagnosis not present

## 2022-08-27 DIAGNOSIS — N63 Unspecified lump in unspecified breast: Secondary | ICD-10-CM | POA: Diagnosis present

## 2022-09-04 ENCOUNTER — Ambulatory Visit (INDEPENDENT_AMBULATORY_CARE_PROVIDER_SITE_OTHER): Payer: TRICARE For Life (TFL) | Admitting: Dermatology

## 2022-09-04 DIAGNOSIS — L988 Other specified disorders of the skin and subcutaneous tissue: Secondary | ICD-10-CM

## 2022-09-04 DIAGNOSIS — L720 Epidermal cyst: Secondary | ICD-10-CM

## 2022-09-04 NOTE — Patient Instructions (Signed)
Due to recent changes in healthcare laws, you may see results of your pathology and/or laboratory studies on MyChart before the doctors have had a chance to review them. We understand that in some cases there may be results that are confusing or concerning to you. Please understand that not all results are received at the same time and often the doctors may need to interpret multiple results in order to provide you with the best plan of care or course of treatment. Therefore, we ask that you please give us 2 business days to thoroughly review all your results before contacting the office for clarification. Should we see a critical lab result, you will be contacted sooner.   If You Need Anything After Your Visit  If you have any questions or concerns for your doctor, please call our main line at [phone] and press option 4 to reach your doctor's medical assistant. If no one answers, please leave a voicemail as directed and we will return your call as soon as possible. Messages left after 4 pm will be answered the following business day.   You may also send us a message via MyChart. We typically respond to MyChart messages within 1-2 business days.  For prescription refills, please ask your pharmacy to contact our office. Our fax number is [phone].  If you have an urgent issue when the clinic is closed that cannot wait until the next business day, you can page your doctor at the number below.    Please note that while we do our best to be available for urgent issues outside of office hours, we are not available 24/7.   If you have an urgent issue and are unable to reach us, you may choose to seek medical care at your doctor's office, retail clinic, urgent care center, or emergency room.  If you have a medical emergency, please immediately call 911 or go to the emergency department.  Pager Numbers  - Dr. Kowalski: [phone]  - Dr. Moye: [phone]  - Dr. Stewart:  [phone]  In the event of inclement weather, please call our main line at [phone] for an update on the status of any delays or closures.  Dermatology Medication Tips: Please keep the boxes that topical medications come in in order to help keep track of the instructions about where and how to use these. Pharmacies typically print the medication instructions only on the boxes and not directly on the medication tubes.   If your medication is too expensive, please contact our office at [phone] option 4 or send us a message through MyChart.   We are unable to tell what your co-pay for medications will be in advance as this is different depending on your insurance coverage. However, we may be able to find a substitute medication at lower cost or fill out paperwork to get insurance to cover a needed medication.   If a prior authorization is required to get your medication covered by your insurance company, please allow us 1-2 business days to complete this process.  Drug prices often vary depending on where the prescription is filled and some pharmacies may offer cheaper prices.  The website www.goodrx.com contains coupons for medications through different pharmacies. The prices here do not account for what the cost may be with help from insurance (it may be cheaper with your insurance), but the website can give you the price if you did not use any insurance.  - You can print the associated coupon and take it with   your prescription to the pharmacy.  - You may also stop by our office during regular business hours and pick up a GoodRx coupon card.  - If you need your prescription sent electronically to a different pharmacy, notify our office through [facility] MyChart or by phone at [phone] option 4.     Si Usted Necesita Algo Despus de Su Visita  Tambin puede enviarnos un mensaje a travs de MyChart. Por lo general respondemos a los mensajes de MyChart en el transcurso de 1 a 2  das hbiles.  Para renovar recetas, por favor pida a su farmacia que se ponga en contacto con nuestra oficina. Nuestro nmero de fax es el [phone].  Si tiene un asunto urgente cuando la clnica est cerrada y que no puede esperar hasta el siguiente da hbil, puede llamar/localizar a su doctor(a) al nmero que aparece a continuacin.   Por favor, tenga en cuenta que aunque hacemos todo lo posible para estar disponibles para asuntos urgentes fuera del horario de oficina, no estamos disponibles las 24 horas del da, los 7 das de la semana.   Si tiene un problema urgente y no puede comunicarse con nosotros, puede optar por buscar atencin mdica  en el consultorio de su doctor(a), en una clnica privada, en un centro de atencin urgente o en una sala de emergencias.  Si tiene una emergencia mdica, por favor llame inmediatamente al 911 o vaya a la sala de emergencias.  Nmeros de bper  - Dr. Kowalski: [phone]  - Dra. Moye: [phone]  - Dra. Stewart: [phone]  En caso de inclemencias del tiempo, por favor llame a nuestra lnea principal al [phone] para una actualizacin sobre el estado de cualquier retraso o cierre.  Consejos para la medicacin en dermatologa: Por favor, guarde las cajas en las que vienen los medicamentos de uso tpico para ayudarle a seguir las instrucciones sobre dnde y cmo usarlos. Las farmacias generalmente imprimen las instrucciones del medicamento slo en las cajas y no directamente en los tubos del medicamento.   Si su medicamento es muy caro, por favor, pngase en contacto con nuestra oficina llamando al [phone] y presione la opcin 4 o envenos un mensaje a travs de MyChart.   No podemos decirle cul ser su copago por los medicamentos por adelantado ya que esto es diferente dependiendo de la cobertura de su seguro. Sin embargo, es posible que podamos encontrar un medicamento sustituto a menor costo o llenar un formulario para que el  seguro cubra el medicamento que se considera necesario.   Si se requiere una autorizacin previa para que su compaa de seguros cubra su medicamento, por favor permtanos de 1 a 2 das hbiles para completar este proceso.  Los precios de los medicamentos varan con frecuencia dependiendo del lugar de dnde se surte la receta y alguna farmacias pueden ofrecer precios ms baratos.  El sitio web www.goodrx.com tiene cupones para medicamentos de diferentes farmacias. Los precios aqu no tienen en cuenta lo que podra costar con la ayuda del seguro (puede ser ms barato con su seguro), pero el sitio web puede darle el precio si no utiliz ningn seguro.  - Puede imprimir el cupn correspondiente y llevarlo con su receta a la farmacia.  - Tambin puede pasar por nuestra oficina durante el horario de atencin regular y recoger una tarjeta de cupones de GoodRx.  - Si necesita que su receta se enve electrnicamente a una farmacia diferente, informe a nuestra oficina a travs de MyChart de [place]   o por telfono llamando al [phone] y presione la opcin 4.  

## 2022-09-04 NOTE — Progress Notes (Signed)
Follow-Up Visit   Subjective  Katelyn Dennis is a 66 y.o. female who presents for the following: Facial Elastosis (Patient is here today for filler and Botox around the mouth and in the lips). Milia of the L upper lip and L infra nasal - patient has been using Aklief for about a month but hasn't noticed any improvement and would like lesions extracted today  The following portions of the chart were reviewed this encounter and updated as appropriate:   Tobacco  Allergies  Meds  Problems  Med Hx  Surg Hx  Fam Hx     Review of Systems:  No other skin or systemic complaints except as noted in HPI or Assessment and Plan.  Objective  Well appearing patient in no apparent distress; mood and affect are within normal limits.  A focused examination was performed including the face. Relevant physical exam findings are noted in the Assessment and Plan.  Face and lips Rhytides and volume loss.                  Left upper lip lat x 1, left infranasal x 1 (2) Smooth white papule(s).    Assessment & Plan  Elastosis of skin Face and lips  Botox 4 units injected into the upper lip   Volbella 0.55 cc x 2 injected into the upper and lower lip vermilion boarder and upper and lower lip pillows Restylane Refyne 1.0 cc injected to bilateral oral commissure and lat chin  Filling material injection - Face and lips Prior to the procedure, the patient's past medical history, allergies and the rare but potential risks and complications were reviewed with the patient and a signed consent was obtained. Pre and post-treatment care was discussed and instructions provided.  Location: lips and perioral  Filler TypeOlena Heckle Lot #[id number] Exp 09/09/2022  Procedure: Lidocaine-tetracaine ointment was applied to treatment areas to achieve good local anesthesia. The area was prepped thoroughly with Puracyn. After introducing the needle into the desired treatment area, the syringe  plunger was drawn back to ensure there was no flash of blood prior to injecting the filler in order to minimize risk of intravascular injection and vascular occlusion. After injection of the filler, the treated areas were cleansed and iced to reduce swelling. Post-treatment instructions were reviewed with the patient.       Patient tolerated the procedure well. The patient will call with any problems, questions or concerns prior to their next appointment.   Botox Injection - Face and lips Location: See attached image  Informed consent: Discussed risks (infection, pain, bleeding, bruising, swelling, allergic reaction, paralysis of nearby muscles, eyelid droop, double vision, neck weakness, difficulty breathing, headache, undesirable cosmetic result, and need for additional treatment) and benefits of the procedure, as well as the alternatives.  Informed consent was obtained.  Preparation: The area was cleansed with alcohol.  Procedure Details:  Botox was injected into the dermis with a 30-gauge needle. Pressure applied to any bleeding. Ice packs offered for swelling.  Lot Number:  [id number] Expiration:  12/25  Total Units Injected:  4  Plan: Patient was instructed to remain upright for 4 hours. Patient was instructed to avoid massaging the face and avoid vigorous exercise for the rest of the day. Tylenol may be used for headache.  Allow 2 weeks before returning to clinic for additional dosing as needed. Patient will call for any problems.   Filling material injection - Face and lips Prior to the procedure, the patient's past  medical history, allergies and the rare but potential risks and complications were reviewed with the patient and a signed consent was obtained. Pre and post-treatment care was discussed and instructions provided.  Location: nasolabial folds and chin  Filler Type: Restylane Refyne Lot #[id number] Exp 05/19/2023  Procedure: The area was prepped thoroughly with Puracyn. After  introducing the needle into the desired treatment area, the syringe plunger was drawn back to ensure there was no flash of blood prior to injecting the filler in order to minimize risk of intravascular injection and vascular occlusion. After injection of the filler, the treated areas were cleansed and iced to reduce swelling. Post-treatment instructions were reviewed with the patient.       Patient tolerated the procedure well. The patient will call with any problems, questions or concerns prior to their next appointment.   Milia (2) Left upper lip lat x 1, left infranasal x 1  Acne/Milia surgery - Left upper lip lat x 1, left infranasal x 1 Procedure risks and benefits were discussed with the patient and verbal consent was obtained. Following prep of the skin on the L upper lip and L infra nasal with an alcohol swab and local anesthesia injection, extraction of milia was performed with a comedone extractor following superficial incision made over their surfaces with a #11 surgical blade. Capillary hemostasis was achieved with 35% aluminum chloride solution. Vaseline ointment was applied to each site. The patient tolerated the procedure well.   Return if symptoms worsen or fail to improve. Documentation: I have reviewed the above documentation for accuracy and completeness, and I agree with the above.  Sarina Ser, MD

## 2022-09-14 ENCOUNTER — Encounter: Payer: Self-pay | Admitting: Dermatology

## 2022-09-21 ENCOUNTER — Other Ambulatory Visit: Payer: Self-pay | Admitting: Obstetrics and Gynecology

## 2022-09-21 DIAGNOSIS — N6321 Unspecified lump in the left breast, upper outer quadrant: Secondary | ICD-10-CM

## 2022-10-05 ENCOUNTER — Ambulatory Visit
Admission: RE | Admit: 2022-10-05 | Discharge: 2022-10-05 | Disposition: A | Discharge status: Home / Self Care | Payer: Medicare Other | Source: Ambulatory Visit | Attending: Obstetrics and Gynecology | Admitting: Obstetrics and Gynecology

## 2022-10-05 DIAGNOSIS — N6321 Unspecified lump in the left breast, upper outer quadrant: Secondary | ICD-10-CM | POA: Insufficient documentation

## 2022-10-15 ENCOUNTER — Other Ambulatory Visit: Payer: Self-pay | Admitting: Internal Medicine

## 2022-10-15 DIAGNOSIS — R928 Other abnormal and inconclusive findings on diagnostic imaging of breast: Secondary | ICD-10-CM

## 2022-10-15 DIAGNOSIS — N63 Unspecified lump in unspecified breast: Secondary | ICD-10-CM

## 2022-11-01 ENCOUNTER — Ambulatory Visit
Admission: RE | Admit: 2022-11-01 | Discharge: 2022-11-01 | Disposition: A | Discharge status: Home / Self Care | Payer: Medicare Other | Source: Ambulatory Visit | Attending: Internal Medicine | Admitting: Internal Medicine

## 2022-11-01 DIAGNOSIS — R928 Other abnormal and inconclusive findings on diagnostic imaging of breast: Secondary | ICD-10-CM | POA: Insufficient documentation

## 2022-11-01 DIAGNOSIS — N63 Unspecified lump in unspecified breast: Secondary | ICD-10-CM

## 2022-11-01 HISTORY — PX: BREAST BIOPSY: SHX20

## 2022-11-01 MED ORDER — LIDOCAINE HCL (PF) 1 % IJ SOLN
5.0000 mL | Freq: Once | INTRAMUSCULAR | Status: AC
  Administered 2022-11-01: 5 mL via INTRADERMAL

## 2022-11-01 MED ORDER — LIDOCAINE-EPINEPHRINE 1 %-1:100000 IJ SOLN
5.0000 mL | Freq: Once | INTRAMUSCULAR | Status: AC
  Administered 2022-11-01: 5 mL via INTRADERMAL

## 2022-11-02 LAB — SURGICAL PATHOLOGY

## 2022-11-07 ENCOUNTER — Encounter: Payer: Self-pay | Admitting: *Deleted

## 2022-11-07 NOTE — Progress Notes (Unsigned)
Referral recieved from [facility] for benign breast mass.  Gave Ms. Sweda the names of several surgeons in the area, she will discuss with her husband and give me a call back to let me know who she would like to see.

## 2022-11-14 ENCOUNTER — Encounter: Payer: Self-pay | Admitting: *Deleted

## 2022-11-14 NOTE — Progress Notes (Signed)
Reached out to patient to see if she has decided on surgeon that she would like to see.  She would like to wait until next week to make a decision.

## 2022-11-18 IMAGING — MG DIGITAL DIAGNOSTIC BILAT W/ TOMO W/ CAD
8 series · 8 of 24 positions shown · non-contrast
Comparison: Previous exam(s).

CLINICAL DATA: Follow-up probably benign right breast 10 o'clock
mass 3 cm from the nipple, first seen in August 2020.

EXAM:
DIGITAL DIAGNOSTIC BILATERAL MAMMOGRAM WITH TOMOSYNTHESIS AND CAD;
ULTRASOUND RIGHT BREAST LIMITED
TECHNIQUE: Bilateral digital diagnostic mammography and breast tomosynthesis
was performed. The images were evaluated with computer-aided
detection.; Targeted ultrasound examination of the right breast was
performed

[R CC synth-2D]
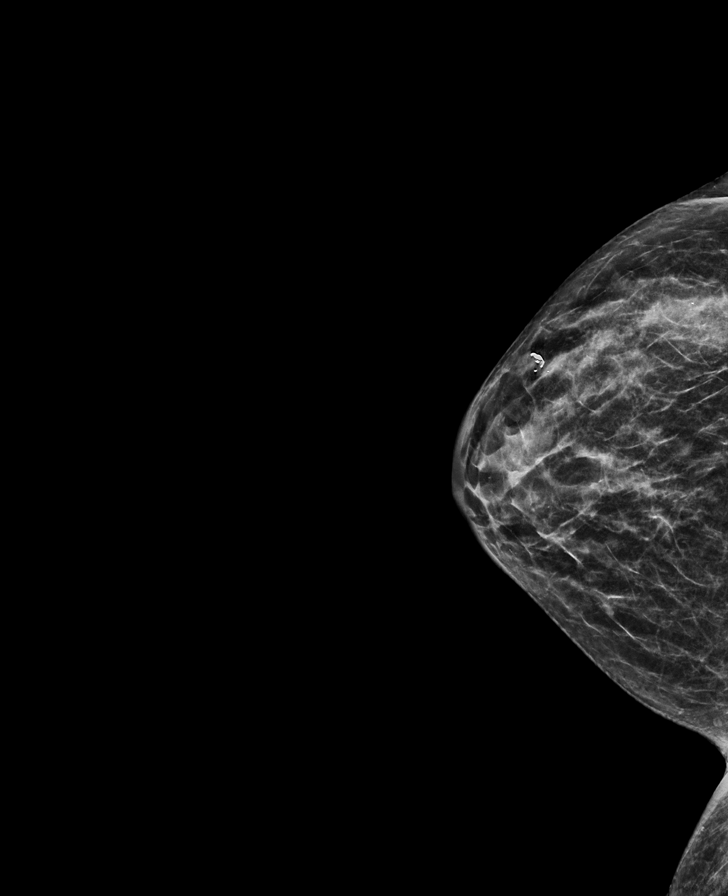

[R MLO synth-2D]
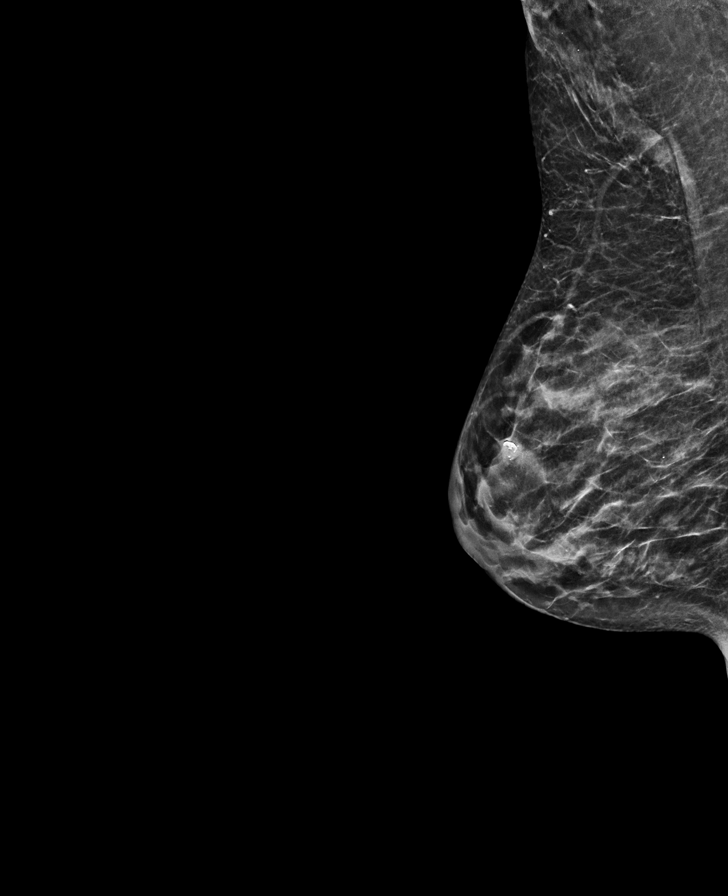

[L CC synth-2D]
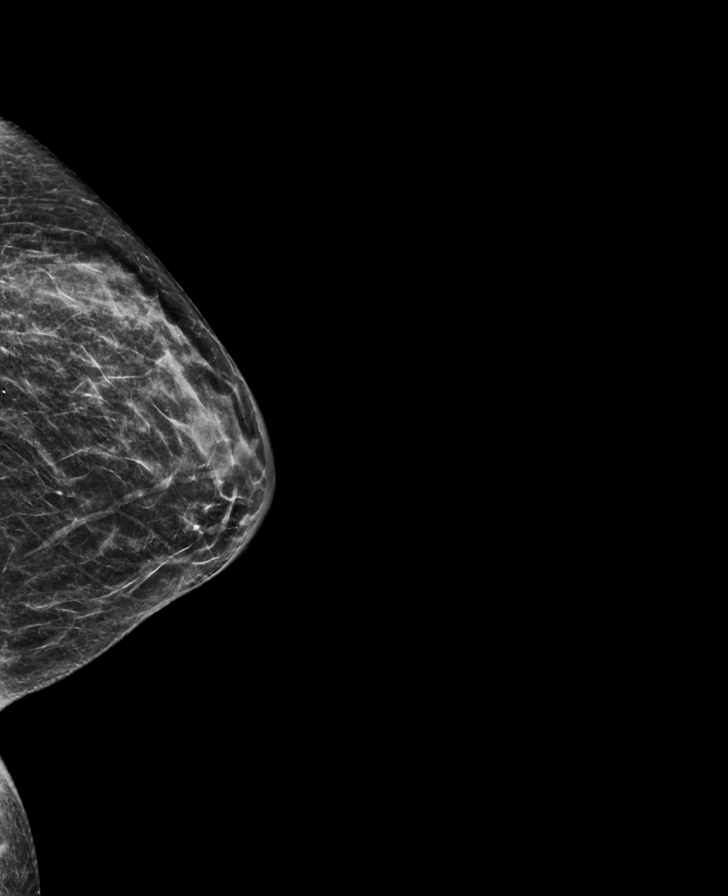

[L MLO synth-2D]
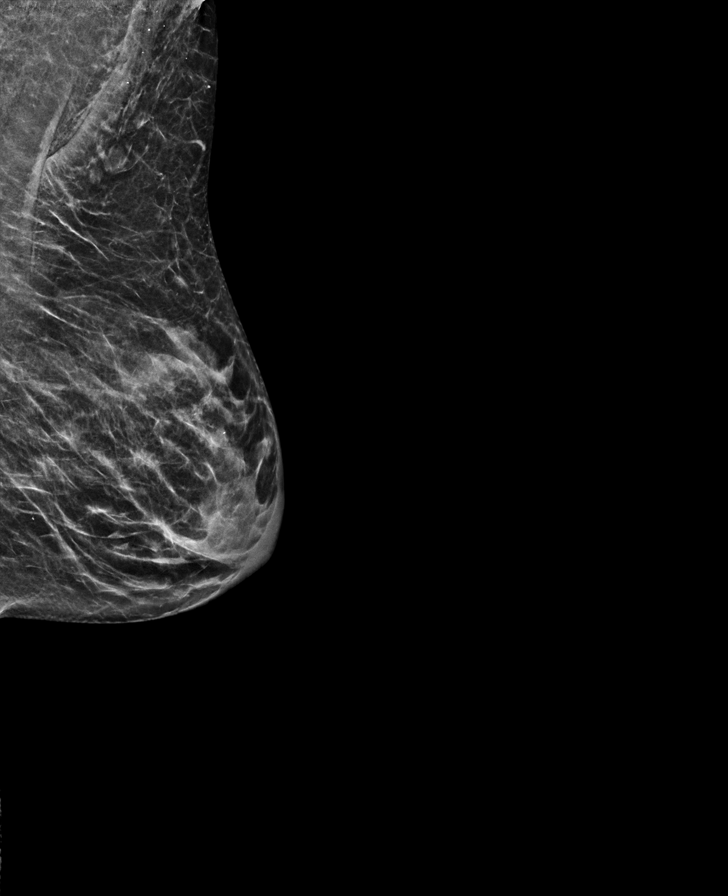

[L CC tomo · tomo slice 25/49.0]
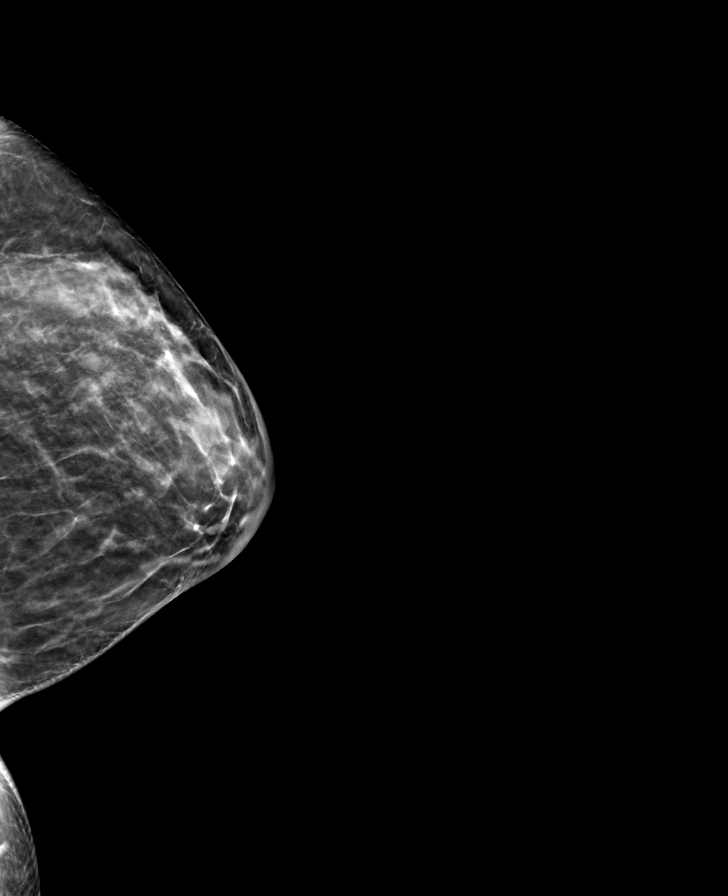

[R MLO tomo · tomo slice 27/53.0]
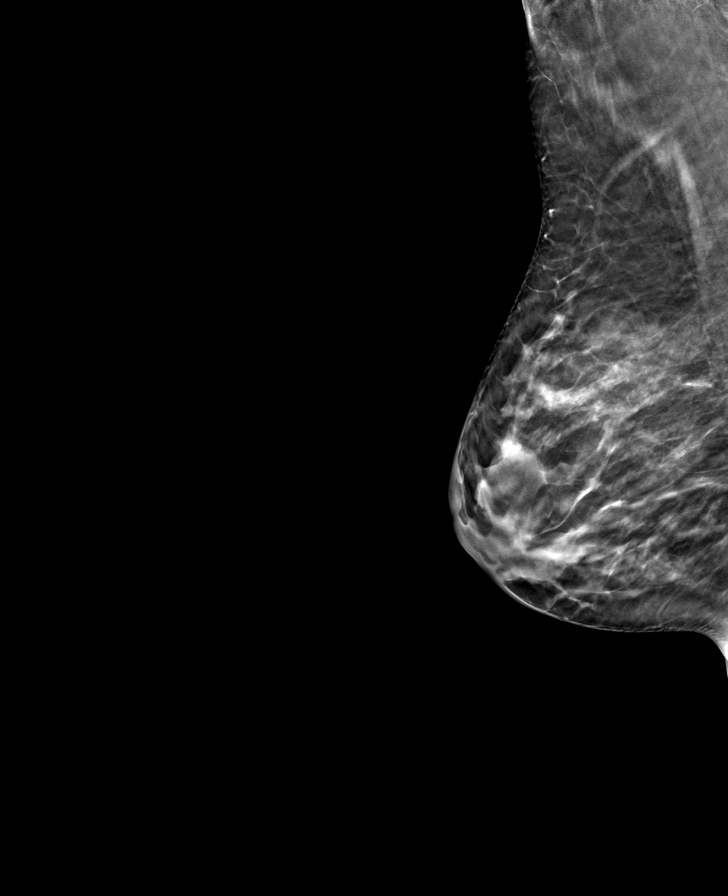

[R CC tomo · tomo slice 25/50.0]
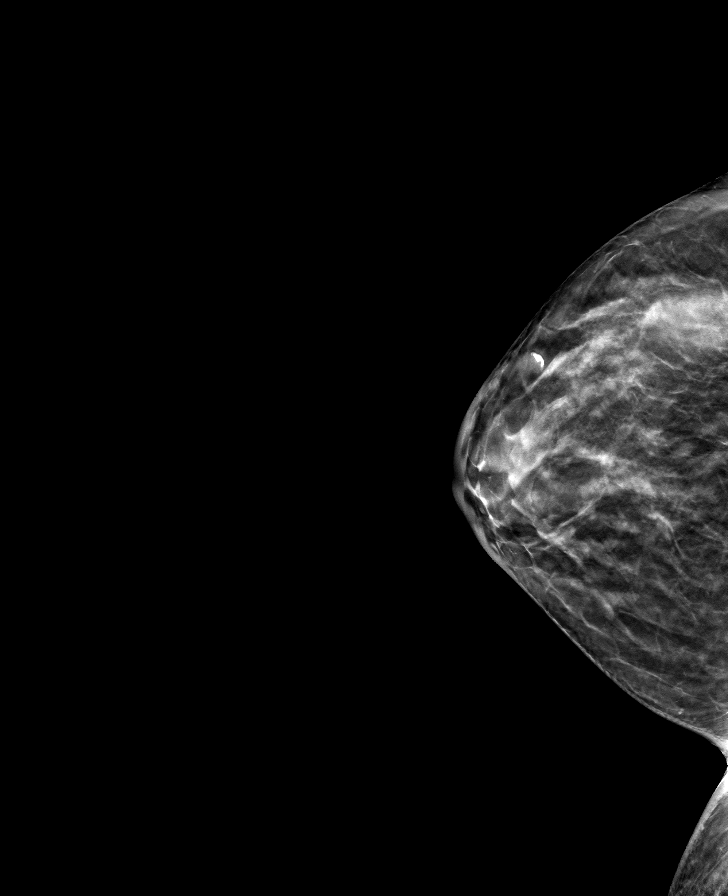

[L MLO tomo · tomo slice 28/55.0]
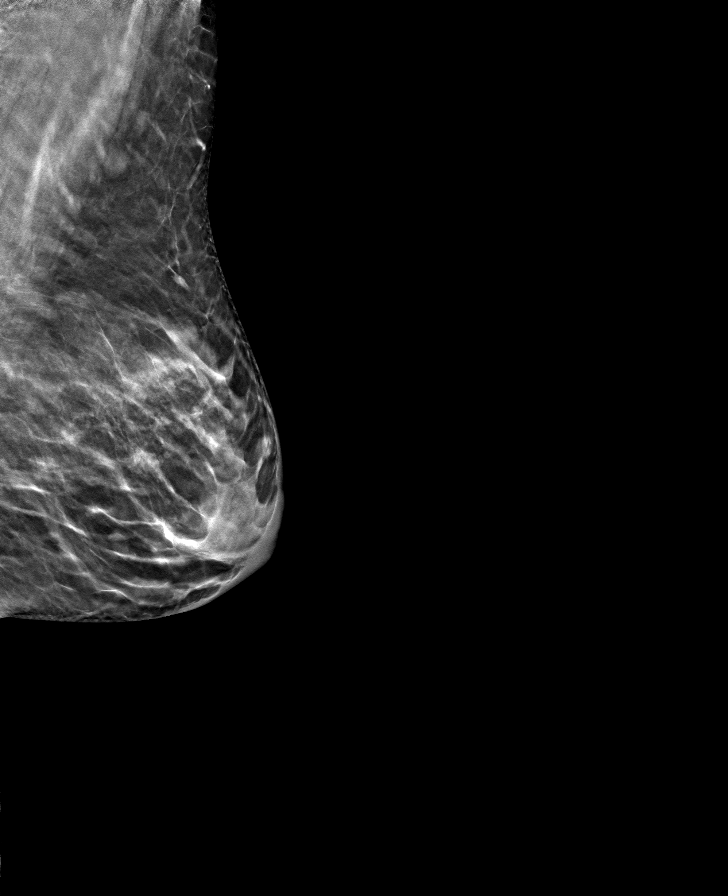

[8 of 24 positions shown; findings below may reference images not displayed]

ACR Breast Density Category c: The breast tissue is heterogeneously
dense, which may obscure small masses.
FINDINGS: Mammographically, there are no suspicious masses, areas of
architectural distortion or microcalcifications in either breast.
Stable benign rim calcified mass is seen in the right breast upper
outer quadrant.

Targeted right breast ultrasound demonstrates stable 2 slightly
smaller benign-appearing hypoechoic circumscribed mass measuring
x 0.3 x 0.5 cm at the right breast 10 o'clock 3 cm from the nipple.
The 8 mm calcified benign mass is also seen.
IMPRESSION: No mammographic evidence of breast malignancy.

Right breast 10 o'clock 3 cm from the nipple probably benign mass.

RECOMMENDATION:
Recommend right diagnostic mammogram and focused right breast
ultrasound in August 2022, to complete patient's 2 year short-term
follow-up.

I have discussed the findings and recommendations with the patient.
If applicable, a reminder letter will be sent to the patient
regarding the next appointment.

BI-RADS CATEGORY  3: Probably benign.

## 2022-11-23 ENCOUNTER — Encounter: Payer: Self-pay | Admitting: *Deleted

## 2022-11-23 NOTE — Progress Notes (Signed)
Called patient to see if she would like a referral sent to the surgeon, left voicemail for return call.

## 2022-12-31 ENCOUNTER — Other Ambulatory Visit: Payer: Self-pay | Admitting: Obstetrics and Gynecology

## 2022-12-31 DIAGNOSIS — N6452 Nipple discharge: Secondary | ICD-10-CM

## 2023-01-01 ENCOUNTER — Other Ambulatory Visit: Payer: Self-pay | Admitting: Obstetrics and Gynecology

## 2023-01-01 DIAGNOSIS — N6452 Nipple discharge: Secondary | ICD-10-CM

## 2023-01-04 ENCOUNTER — Ambulatory Visit
Admission: RE | Admit: 2023-01-04 | Discharge: 2023-01-04 | Disposition: A | Discharge status: Home / Self Care | Payer: Medicare Other | Source: Ambulatory Visit | Attending: Obstetrics and Gynecology | Admitting: Obstetrics and Gynecology

## 2023-01-04 DIAGNOSIS — N6452 Nipple discharge: Secondary | ICD-10-CM | POA: Diagnosis present

## 2023-01-08 ENCOUNTER — Other Ambulatory Visit: Payer: Self-pay | Admitting: Obstetrics and Gynecology

## 2023-01-08 DIAGNOSIS — R928 Other abnormal and inconclusive findings on diagnostic imaging of breast: Secondary | ICD-10-CM

## 2023-01-15 ENCOUNTER — Ambulatory Visit
Admission: RE | Admit: 2023-01-15 | Discharge: 2023-01-15 | Disposition: A | Discharge status: Home / Self Care | Payer: Medicare Other | Source: Ambulatory Visit | Attending: Obstetrics and Gynecology | Admitting: Obstetrics and Gynecology

## 2023-01-15 ENCOUNTER — Other Ambulatory Visit: Payer: Self-pay | Admitting: Obstetrics and Gynecology

## 2023-01-15 DIAGNOSIS — R928 Other abnormal and inconclusive findings on diagnostic imaging of breast: Secondary | ICD-10-CM

## 2023-01-15 HISTORY — PX: BREAST BIOPSY: SHX20

## 2023-01-15 MED ORDER — LIDOCAINE HCL (PF) 1 % IJ SOLN
3.0000 mL | Freq: Once | INTRAMUSCULAR | Status: AC
  Administered 2023-01-15: 3 mL
  Filled 2023-01-15: qty 4

## 2023-01-15 MED ORDER — LIDOCAINE-EPINEPHRINE 1 %-1:100000 IJ SOLN
8.0000 mL | Freq: Once | INTRAMUSCULAR | Status: AC
  Administered 2023-01-15: 8 mL
  Filled 2023-01-15: qty 8

## 2023-01-16 ENCOUNTER — Encounter: Payer: Self-pay | Admitting: *Deleted

## 2023-01-16 LAB — SURGICAL PATHOLOGY

## 2023-01-16 NOTE — Progress Notes (Signed)
Referral recieved from [facility] for benign breast mass.  Katelyn Dennis is deciding which surgeon she would like to see and will call me back with her decision.

## 2023-01-25 ENCOUNTER — Encounter: Payer: Self-pay | Admitting: *Deleted

## 2023-01-25 NOTE — Progress Notes (Signed)
Ms. Iribe has decided she would like to see Dr. Donne Hazel at [facility].  Referral sent, their office will call with the appointment.

## 2023-03-06 ENCOUNTER — Encounter: Payer: Self-pay | Admitting: Dermatology

## 2023-03-06 ENCOUNTER — Ambulatory Visit (INDEPENDENT_AMBULATORY_CARE_PROVIDER_SITE_OTHER): Payer: Medicare Other | Admitting: Dermatology

## 2023-03-06 VITALS — BP 102/62 | HR 74

## 2023-03-06 DIAGNOSIS — L821 Other seborrheic keratosis: Secondary | ICD-10-CM

## 2023-03-06 DIAGNOSIS — I8310 Varicose veins of unspecified lower extremity with inflammation: Secondary | ICD-10-CM | POA: Diagnosis not present

## 2023-03-06 DIAGNOSIS — L814 Other melanin hyperpigmentation: Secondary | ICD-10-CM

## 2023-03-06 DIAGNOSIS — L578 Other skin changes due to chronic exposure to nonionizing radiation: Secondary | ICD-10-CM | POA: Diagnosis not present

## 2023-03-06 DIAGNOSIS — L820 Inflamed seborrheic keratosis: Secondary | ICD-10-CM

## 2023-03-06 MED ORDER — VALACYCLOVIR HCL 500 MG PO TABS: 500.0000 mg | ORAL_TABLET | Freq: Two times a day (BID) | ORAL | 0 refills | Status: DC

## 2023-03-06 NOTE — Progress Notes (Signed)
Follow-Up Visit   Subjective  Katelyn Dennis is a 67 y.o. female who presents for the following:  BBL treatment for brown spots at face, patient also has concerns about veins at upper right thigh and would like to discuss treatment, scaly area posterior neck, and discuss need for fillers at lips and oral commissures.   The following portions of the chart were reviewed this encounter and updated as appropriate: medications, allergies, medical history  Review of Systems:  No other skin or systemic complaints except as noted in HPI or Assessment and Plan.  Objective  Well appearing patient in no apparent distress; mood and affect are within normal limits.  A focused examination was performed of the face. Relevant physical exam findings are noted in the Assessment and Plan or shown in photos.  Before photos                right posterior base of neck x 1 Erythematous stuck-on, waxy papule or plaque   Assessment & Plan   Inflamed seborrheic keratosis right posterior base of neck x 1  Symptomatic, irritating, patient would like treated.   Will plan to treat isk's at patient's hairline at next follow up  Destruction of lesion - right posterior base of neck x 1 Complexity: simple   Destruction method: cryotherapy   Informed consent: discussed and consent obtained   Timeout:  patient name, date of birth, surgical site, and procedure verified Lesion destroyed using liquid nitrogen: Yes   Region frozen until ice ball extended beyond lesion: Yes   Outcome: patient tolerated procedure well with no complications   Post-procedure details: wound care instructions given     Facial Elastosis For fillers recommend holding off on treatment today for another 3 - 6 months  Patient concerned with fine crepiness and fine wrinkling and drapy skin at left side mouth and lower face. Discussed different procedures recommended to help with skin elasticity such as Halo; Fraxel; face  lift; SkinTyte etc.  Prior to the procedure, the patient's past medical history, allergies and the rare but potential risks and complications were reviewed with the patient and a signed consent was obtained. Pre and post-treatment care was discussed and instructions provided.   LENTIGINES with ACTINIC SKIN DAMAGE Exam: scattered tan macules Due to sun exposure Treatment Plan: BBL today Start Valtrex 500mg  1 po bid for 7 days   [place] BBL - 03/06/23 1700      Patient Details   Skin Type: --   II   Anesthestic Cream Applied: No    Photo Takes: Yes    Consent Signed: Yes      Treatment Details   Date: 03/06/23    Treatment #: 1    Area: Face    Filter: 1st Pass      1st Pass   Device: 515 filter    BBL j/cm2: 12    PW Msec Sec: 10    Cooling Temp: 25    Pulses: 118    15x15: this crystal used    11mm: this crystal used      Patient tolerated the procedure well.   Patient given ice face mask and  BBL Starter skin care kit at appointment today.   Wynelle Link avoidance was stressed. The patient will call with any problems, questions or concerns prior to their next appointment.   SEBORRHEIC KERATOSIS - Stuck-on, waxy, tan-brown papules and/or plaques  - Benign-appearing - Discussed benign etiology and prognosis. - Observe - Call for any changes  Varicose Veins/Spider Veins - Dilated blue, purple or red veins at the lower extremities - Reassured - Smaller vessels can be treated by sclerotherapy (a procedure to inject a medicine into the veins to make them disappear) if desired, but the treatment is not covered by insurance. Larger vessels may be covered if symptomatic and we would refer to vascular surgeon if treatment desired.  Discussed sclerotherapy for spider vein treatment.  Discussed that it is a cosmetic procedure and is not covered by insurance ($350/treatment).  Treatment consists of injecting the spider veins with a medicine called Asclera to help them disappear.   Multiple treatments are generally necessary to get best results.  Risks including bruising and persistent discoloration due to post-inflammatory hyperpigmentation.  Sclerotherapy does not prevent the development of new spider veins.  Daily compression hose for two weeks after procedure is recommended.  ACTINIC DAMAGE - chronic, secondary to cumulative UV radiation exposure/sun exposure over time - diffuse scaly erythematous macules with underlying dyspigmentation - Recommend daily broad spectrum sunscreen SPF 30+ to sun-exposed areas, reapply every 2 hours as needed.  - Recommend staying in the shade or wearing long sleeves, sun glasses (UVA+UVB protection) and wide brim hats (4-inch brim around the entire circumference of the hat). - Call for new or changing lesions.   Return for 3 - 6 month filler and botox.  IAsher Muir, CMA, am acting as scribe for Armida Sans, MD.  Documentation: I have reviewed the above documentation for accuracy and completeness, and I agree with the above.  Armida Sans, MD

## 2023-03-06 NOTE — Patient Instructions (Addendum)
For veins at right upper thigh   Discussed sclerotherapy for spider vein treatment.  Discussed that it is a cosmetic procedure and is not covered by insurance ($350/treatment).  Treatment consists of injecting the spider veins with a medicine called Asclera to help them disappear.  Multiple treatments are generally necessary to get best results.  Risks including bruising and persistent discoloration due to post-inflammatory hyperpigmentation.  Sclerotherapy does not prevent the development of new spider veins.  Daily compression hose for two weeks after procedure is recommended.  For Lips and filler  Hold off a 3 - 6 months   Counseling for BBL / IPL / Laser and Coordination of Care Discussed the treatment option of Broad Band Light (BBL) /Intense Pulsed Light (IPL)/ Laser for skin discoloration, including brown spots and redness.  Typically we recommend at least 1-3 treatment sessions about 5-8 weeks apart for best results.  Cannot have tanned skin when BBL performed, and regular use of sunscreen is advised after the procedure to help maintain results. The patient's condition may also require "maintenance treatments" in the future.  The fee for BBL / laser treatments is $350 per treatment session for the whole face.  A fee can be quoted for other parts of the body.  Insurance typically does not pay for BBL/laser treatments and therefore the fee is an out-of-pocket cost.    Due to recent changes in healthcare laws, you may see results of your pathology and/or laboratory studies on MyChart before the doctors have had a chance to review them. We understand that in some cases there may be results that are confusing or concerning to you. Please understand that not all results are received at the same time and often the doctors may need to interpret multiple results in order to provide you with the best plan of care or course of treatment. Therefore, we ask that you please give [place] 2 business days to  thoroughly review all your results before contacting the office for clarification. Should we see a critical lab result, you will be contacted sooner.   If You Need Anything After Your Visit  If you have any questions or concerns for your doctor, please call our main line at [phone] and press option 4 to reach your doctor's medical assistant. If no one answers, please leave a voicemail as directed and we will return your call as soon as possible. Messages left after 4 pm will be answered the following business day.   You may also send [place] a message via MyChart. We typically respond to MyChart messages within 1-2 business days.  For prescription refills, please ask your pharmacy to contact our office. Our fax number is [phone].  If you have an urgent issue when the clinic is closed that cannot wait until the next business day, you can page your doctor at the number below.    Please note that while we do our best to be available for urgent issues outside of office hours, we are not available 24/7.   If you have an urgent issue and are unable to reach [place], you may choose to seek medical care at your doctor's office, retail clinic, urgent care center, or emergency room.  If you have a medical emergency, please immediately call 911 or go to the emergency department.  Pager Numbers  - Dr. Gwen Pounds: [phone]  - Dr. Neale Burly: [phone]  - Dr. Roseanne Reno: [phone]  In the event of inclement weather, please call our main line at [phone] for an  update on the status of any delays or closures.  Dermatology Medication Tips: Please keep the boxes that topical medications come in in order to help keep track of the instructions about where and how to use these. Pharmacies typically print the medication instructions only on the boxes and not directly on the medication tubes.   If your medication is too expensive, please contact our office at [phone] option 4 or send [place] a message  through MyChart.   We are unable to tell what your co-pay for medications will be in advance as this is different depending on your insurance coverage. However, we may be able to find a substitute medication at lower cost or fill out paperwork to get insurance to cover a needed medication.   If a prior authorization is required to get your medication covered by your insurance company, please allow [place] 1-2 business days to complete this process.  Drug prices often vary depending on where the prescription is filled and some pharmacies may offer cheaper prices.  The website www.goodrx.com contains coupons for medications through different pharmacies. The prices here do not account for what the cost may be with help from insurance (it may be cheaper with your insurance), but the website can give you the price if you did not use any insurance.  - You can print the associated coupon and take it with your prescription to the pharmacy.  - You may also stop by our office during regular business hours and pick up a GoodRx coupon card.  - If you need your prescription sent electronically to a different pharmacy, notify our office through [facility] or by phone at [phone] option 4.     Si Usted Necesita Algo Despus de Su Visita  Tambin puede enviarnos un mensaje a travs de Clinical cytogeneticist. Por lo general respondemos a los mensajes de MyChart en el transcurso de 1 a 2 das hbiles.  Para renovar recetas, por favor pida a su farmacia que se ponga en contacto con nuestra oficina. Annie Sable de fax es [place] [phone].  Si tiene un asunto urgente cuando la clnica est cerrada y que no puede esperar hasta el siguiente da hbil, puede llamar/localizar a su doctor(a) al nmero que aparece a continuacin.   Por favor, tenga en cuenta que aunque hacemos todo lo posible para estar disponibles para asuntos urgentes fuera del horario de [place], no estamos disponibles las 24 horas del da, los 7 [address]  [address] de  la [place].   Si tiene un problema urgente y no puede comunicarse con nosotros, puede optar por buscar atencin mdica  en el consultorio de su doctor(a), en una clnica privada, en un centro de atencin urgente o en una sala de emergencias.  Si tiene Engineer, drilling, por favor llame inmediatamente al 911 o vaya a la sala de emergencias.  Nmeros de bper  - Dr. Gwen Pounds: [phone]  - Dra. Moye: [phone]  - Dra. Roseanne Reno: [phone]  En caso de inclemencias del [place], por favor llame a Lacy Duverney principal al [phone] para una actualizacin sobre el [place] de cualquier retraso o cierre.  Consejos para la medicacin en dermatologa: Por favor, guarde las cajas en las que vienen los medicamentos de uso tpico para ayudarle a seguir las instrucciones sobre dnde y cmo usarlos. Las farmacias generalmente imprimen las instrucciones del medicamento slo en las cajas y no directamente en los tubos del [place].   Si su medicamento es [employer], por favor, pngase en contacto con nuestra oficina llamando  al [phone] y presione la opcin 4 o envenos un mensaje a travs de Clinical cytogeneticist.   No podemos decirle cul ser su copago por los medicamentos por adelantado ya que esto es diferente dependiendo de la cobertura de su seguro. Sin embargo, es posible que podamos encontrar un medicamento sustituto a Audiological scientist un formulario para que el seguro cubra el medicamento que se considera necesario.   Si se requiere una autorizacin previa para que su compaa de seguros [place] su medicamento, por favor permtanos de 1 a 2 das hbiles para completar [address]  Los precios de los medicamentos varan con frecuencia dependiendo del Environmental consultant de dnde se surte la receta y alguna farmacias pueden ofrecer precios ms baratos.  El sitio web www.goodrx.com tiene cupones para medicamentos de Health and safety inspector. Los precios aqu no tienen en cuenta lo que podra costar con la ayuda  del seguro (puede ser ms barato con su seguro), pero el sitio web puede darle el precio si no utiliz Tourist information centre manager.  - Puede imprimir el cupn correspondiente y llevarlo con su receta a la farmacia.  - Tambin puede pasar por nuestra oficina durante el horario de atencin regular y Education officer, museum una tarjeta de cupones de GoodRx.  - Si necesita que su receta se enve electrnicamente a una farmacia diferente, informe a nuestra oficina a travs de MyChart de [place] o por telfono llamando al [phone] y presione la opcin 4.

## 2023-03-07 ENCOUNTER — Other Ambulatory Visit: Payer: Self-pay | Admitting: General Surgery

## 2023-03-07 DIAGNOSIS — N6341 Unspecified lump in right breast, subareolar: Secondary | ICD-10-CM

## 2023-03-11 ENCOUNTER — Other Ambulatory Visit: Payer: Self-pay | Admitting: General Surgery

## 2023-03-11 DIAGNOSIS — N6341 Unspecified lump in right breast, subareolar: Secondary | ICD-10-CM

## 2023-03-18 ENCOUNTER — Encounter: Payer: Self-pay | Admitting: Dermatology

## 2023-03-27 ENCOUNTER — Other Ambulatory Visit: Payer: Self-pay

## 2023-03-27 ENCOUNTER — Encounter (HOSPITAL_BASED_OUTPATIENT_CLINIC_OR_DEPARTMENT_OTHER): Payer: Self-pay | Admitting: General Surgery

## 2023-04-02 ENCOUNTER — Ambulatory Visit
Admission: RE | Admit: 2023-04-02 | Discharge: 2023-04-02 | Disposition: A | Discharge status: Home / Self Care | Payer: Medicare Other | Source: Ambulatory Visit | Attending: General Surgery | Admitting: General Surgery

## 2023-04-02 DIAGNOSIS — N6341 Unspecified lump in right breast, subareolar: Secondary | ICD-10-CM

## 2023-04-02 HISTORY — PX: BREAST BIOPSY: SHX20

## 2023-04-02 MED ORDER — ENSURE PRE-SURGERY PO LIQD: 296.0000 mL | Freq: Once | ORAL | Status: DC

## 2023-04-02 MED ORDER — CHLORHEXIDINE GLUCONATE CLOTH 2 % EX PADS: 6.0000 | MEDICATED_PAD | Freq: Once | CUTANEOUS | Status: DC

## 2023-04-02 NOTE — Progress Notes (Signed)

## 2023-04-03 ENCOUNTER — Encounter (HOSPITAL_BASED_OUTPATIENT_CLINIC_OR_DEPARTMENT_OTHER): Payer: Self-pay | Admitting: General Surgery

## 2023-04-03 ENCOUNTER — Other Ambulatory Visit: Payer: Self-pay

## 2023-04-03 ENCOUNTER — Ambulatory Visit
Admission: RE | Admit: 2023-04-03 | Discharge: 2023-04-03 | Disposition: A | Discharge status: Home / Self Care | Payer: Medicare Other | Source: Ambulatory Visit | Attending: General Surgery | Admitting: General Surgery

## 2023-04-03 ENCOUNTER — Encounter (HOSPITAL_BASED_OUTPATIENT_CLINIC_OR_DEPARTMENT_OTHER)
Admission: RE | Disposition: A | Discharge status: Home / Self Care | Payer: Self-pay | Source: Home / Self Care | Attending: General Surgery

## 2023-04-03 ENCOUNTER — Ambulatory Visit (HOSPITAL_BASED_OUTPATIENT_CLINIC_OR_DEPARTMENT_OTHER)
Admission: RE | Admit: 2023-04-03 | Discharge: 2023-04-03 | Disposition: A | Discharge status: Home / Self Care | Payer: Medicare Other | Attending: General Surgery | Admitting: General Surgery

## 2023-04-03 ENCOUNTER — Ambulatory Visit (HOSPITAL_BASED_OUTPATIENT_CLINIC_OR_DEPARTMENT_OTHER): Payer: Medicare Other | Admitting: Anesthesiology

## 2023-04-03 DIAGNOSIS — K449 Diaphragmatic hernia without obstruction or gangrene: Secondary | ICD-10-CM

## 2023-04-03 DIAGNOSIS — N6011 Diffuse cystic mastopathy of right breast: Secondary | ICD-10-CM | POA: Diagnosis not present

## 2023-04-03 DIAGNOSIS — Z79899 Other long term (current) drug therapy: Secondary | ICD-10-CM | POA: Diagnosis not present

## 2023-04-03 DIAGNOSIS — K219 Gastro-esophageal reflux disease without esophagitis: Secondary | ICD-10-CM | POA: Diagnosis not present

## 2023-04-03 DIAGNOSIS — I10 Essential (primary) hypertension: Secondary | ICD-10-CM

## 2023-04-03 DIAGNOSIS — M1990 Unspecified osteoarthritis, unspecified site: Secondary | ICD-10-CM

## 2023-04-03 DIAGNOSIS — N6452 Nipple discharge: Secondary | ICD-10-CM | POA: Insufficient documentation

## 2023-04-03 DIAGNOSIS — M341 CR(E)ST syndrome: Secondary | ICD-10-CM | POA: Insufficient documentation

## 2023-04-03 DIAGNOSIS — D241 Benign neoplasm of right breast: Secondary | ICD-10-CM | POA: Diagnosis not present

## 2023-04-03 DIAGNOSIS — N6021 Fibroadenosis of right breast: Secondary | ICD-10-CM | POA: Insufficient documentation

## 2023-04-03 DIAGNOSIS — N6341 Unspecified lump in right breast, subareolar: Secondary | ICD-10-CM

## 2023-04-03 HISTORY — DX: Unspecified osteoarthritis, unspecified site: M19.90

## 2023-04-03 HISTORY — PX: RADIOACTIVE SEED GUIDED EXCISIONAL BREAST BIOPSY: SHX6490

## 2023-04-03 SURGERY — RADIOACTIVE SEED GUIDED BREAST BIOPSY
Anesthesia: General | Site: Breast | Laterality: Right

## 2023-04-03 MED ORDER — ACETAMINOPHEN 500 MG PO TABS
1000.0000 mg | ORAL_TABLET | ORAL | Status: AC
  Administered 2023-04-03: 1000 mg via ORAL

## 2023-04-03 MED ORDER — PHENYLEPHRINE 80 MCG/ML (10ML) SYRINGE FOR IV PUSH (FOR BLOOD PRESSURE SUPPORT)
PREFILLED_SYRINGE | INTRAVENOUS | Status: AC
  Filled 2023-04-03: qty 50

## 2023-04-03 MED ORDER — MIDAZOLAM HCL 2 MG/2ML IJ SOLN
INTRAMUSCULAR | Status: AC
  Filled 2023-04-03: qty 2

## 2023-04-03 MED ORDER — CEFAZOLIN SODIUM-DEXTROSE 2-4 GM/100ML-% IV SOLN
INTRAVENOUS | Status: AC
  Filled 2023-04-03: qty 100

## 2023-04-03 MED ORDER — BUPIVACAINE HCL (PF) 0.25 % IJ SOLN
INTRAMUSCULAR | Status: DC | PRN
  Administered 2023-04-03: 7 mL

## 2023-04-03 MED ORDER — LACTATED RINGERS IV SOLN: INTRAVENOUS | Status: DC

## 2023-04-03 MED ORDER — FENTANYL CITRATE (PF) 100 MCG/2ML IJ SOLN
INTRAMUSCULAR | Status: AC
  Filled 2023-04-03: qty 2

## 2023-04-03 MED ORDER — ACETAMINOPHEN 500 MG PO TABS
ORAL_TABLET | ORAL | Status: AC
  Filled 2023-04-03: qty 2

## 2023-04-03 MED ORDER — DEXAMETHASONE SODIUM PHOSPHATE 10 MG/ML IJ SOLN
INTRAMUSCULAR | Status: AC
  Filled 2023-04-03: qty 1

## 2023-04-03 MED ORDER — MIDAZOLAM HCL 5 MG/5ML IJ SOLN
INTRAMUSCULAR | Status: DC | PRN
  Administered 2023-04-03: 1 mg via INTRAVENOUS

## 2023-04-03 MED ORDER — ONDANSETRON HCL 4 MG/2ML IJ SOLN
INTRAMUSCULAR | Status: DC | PRN
  Administered 2023-04-03: 4 mg via INTRAVENOUS

## 2023-04-03 MED ORDER — PROPOFOL 10 MG/ML IV BOLUS
INTRAVENOUS | Status: DC | PRN
  Administered 2023-04-03: 120 mg via INTRAVENOUS

## 2023-04-03 MED ORDER — ONDANSETRON HCL 4 MG/2ML IJ SOLN
INTRAMUSCULAR | Status: AC
  Filled 2023-04-03: qty 2

## 2023-04-03 MED ORDER — PROPOFOL 10 MG/ML IV BOLUS
INTRAVENOUS | Status: AC
  Filled 2023-04-03: qty 20

## 2023-04-03 MED ORDER — TRAMADOL HCL 50 MG PO TABS: 50.0000 mg | ORAL_TABLET | Freq: Four times a day (QID) | ORAL | 0 refills | Status: AC | PRN

## 2023-04-03 MED ORDER — EPHEDRINE 5 MG/ML INJ
INTRAVENOUS | Status: AC
  Filled 2023-04-03: qty 30

## 2023-04-03 MED ORDER — EPHEDRINE SULFATE (PRESSORS) 50 MG/ML IJ SOLN
INTRAMUSCULAR | Status: DC | PRN
  Administered 2023-04-03: 10 mg via INTRAVENOUS

## 2023-04-03 MED ORDER — KETOROLAC TROMETHAMINE 30 MG/ML IJ SOLN
INTRAMUSCULAR | Status: AC
  Filled 2023-04-03: qty 1

## 2023-04-03 MED ORDER — FENTANYL CITRATE (PF) 100 MCG/2ML IJ SOLN
INTRAMUSCULAR | Status: DC | PRN
  Administered 2023-04-03: 50 ug via INTRAVENOUS

## 2023-04-03 MED ORDER — CEFAZOLIN SODIUM-DEXTROSE 2-4 GM/100ML-% IV SOLN
2.0000 g | INTRAVENOUS | Status: AC
  Administered 2023-04-03: 2 g via INTRAVENOUS

## 2023-04-03 MED ORDER — DEXAMETHASONE SODIUM PHOSPHATE 4 MG/ML IJ SOLN
INTRAMUSCULAR | Status: DC | PRN
  Administered 2023-04-03: 5 mg via INTRAVENOUS

## 2023-04-03 MED ORDER — LIDOCAINE 2% (20 MG/ML) 5 ML SYRINGE
INTRAMUSCULAR | Status: AC
  Filled 2023-04-03: qty 5

## 2023-04-03 MED ORDER — LIDOCAINE HCL (CARDIAC) PF 100 MG/5ML IV SOSY
PREFILLED_SYRINGE | INTRAVENOUS | Status: DC | PRN
  Administered 2023-04-03: 50 mg via INTRAVENOUS

## 2023-04-03 SURGICAL SUPPLY — 54 items
ADH SKN CLS APL DERMABOND .7 (GAUZE/BANDAGES/DRESSINGS) ×1
APL PRP STRL LF DISP 70% ISPRP (MISCELLANEOUS) ×1
APPLIER CLIP 9.375 MED OPEN (MISCELLANEOUS)
APR CLP MED 9.3 20 MLT OPN (MISCELLANEOUS)
BINDER BREAST LRG (GAUZE/BANDAGES/DRESSINGS) IMPLANT
BINDER BREAST MEDIUM (GAUZE/BANDAGES/DRESSINGS) IMPLANT
BINDER BREAST XLRG (GAUZE/BANDAGES/DRESSINGS) IMPLANT
BINDER BREAST XXLRG (GAUZE/BANDAGES/DRESSINGS) IMPLANT
BLADE SURG 15 STRL LF DISP TIS (BLADE) ×1 IMPLANT
BLADE SURG 15 STRL SS (BLADE) ×1
CANISTER SUC SOCK COL 7IN (MISCELLANEOUS) IMPLANT
CANISTER SUCT 1200ML W/VALVE (MISCELLANEOUS) IMPLANT
CHLORAPREP W/TINT 26 (MISCELLANEOUS) ×1 IMPLANT
CLIP APPLIE 9.375 MED OPEN (MISCELLANEOUS) IMPLANT
CLIP TI WIDE RED SMALL 6 (CLIP) IMPLANT
COVER BACK TABLE 60X90IN (DRAPES) ×1 IMPLANT
COVER MAYO STAND STRL (DRAPES) ×1 IMPLANT
COVER PROBE CYLINDRICAL 5X96 (MISCELLANEOUS) ×1 IMPLANT
DERMABOND ADVANCED .7 DNX12 (GAUZE/BANDAGES/DRESSINGS) ×1 IMPLANT
DRAPE LAPAROSCOPIC ABDOMINAL (DRAPES) ×1 IMPLANT
DRAPE UTILITY XL STRL (DRAPES) ×1 IMPLANT
DRSG TEGADERM 4X4.75 (GAUZE/BANDAGES/DRESSINGS) IMPLANT
ELECT COATED BLADE 2.86 ST (ELECTRODE) ×1 IMPLANT
ELECT REM PT RETURN 9FT ADLT (ELECTROSURGICAL) ×1
ELECTRODE REM PT RTRN 9FT ADLT (ELECTROSURGICAL) ×1 IMPLANT
GAUZE SPONGE 4X4 12PLY STRL LF (GAUZE/BANDAGES/DRESSINGS) IMPLANT
GLOVE BIO SURGEON STRL SZ7 (GLOVE) ×2 IMPLANT
GLOVE BIOGEL PI IND STRL 7.5 (GLOVE) ×1 IMPLANT
GOWN STRL REUS W/ TWL LRG LVL3 (GOWN DISPOSABLE) ×2 IMPLANT
GOWN STRL REUS W/TWL LRG LVL3 (GOWN DISPOSABLE) ×2
HEMOSTAT ARISTA ABSORB 3G PWDR (HEMOSTASIS) IMPLANT
KIT MARKER MARGIN INK (KITS) ×1 IMPLANT
NDL HYPO 25X1 1.5 SAFETY (NEEDLE) ×1 IMPLANT
NEEDLE HYPO 25X1 1.5 SAFETY (NEEDLE) ×1 IMPLANT
NS IRRIG 1000ML POUR BTL (IV SOLUTION) IMPLANT
PACK BASIN DAY SURGERY FS (CUSTOM PROCEDURE TRAY) ×1 IMPLANT
PENCIL SMOKE EVACUATOR (MISCELLANEOUS) ×1 IMPLANT
RETRACTOR ONETRAX LX 90X20 (MISCELLANEOUS) IMPLANT
SLEEVE SCD COMPRESS KNEE MED (STOCKING) ×1 IMPLANT
SPIKE FLUID TRANSFER (MISCELLANEOUS) IMPLANT
SPONGE T-LAP 4X18 ~~LOC~~+RFID (SPONGE) ×1 IMPLANT
STRIP CLOSURE SKIN 1/2X4 (GAUZE/BANDAGES/DRESSINGS) ×1 IMPLANT
SUT MNCRL AB 4-0 PS2 18 (SUTURE) IMPLANT
SUT MON AB 5-0 PS2 18 (SUTURE) IMPLANT
SUT SILK 2 0 SH (SUTURE) IMPLANT
SUT VIC AB 2-0 SH 27 (SUTURE) ×1
SUT VIC AB 2-0 SH 27XBRD (SUTURE) ×1 IMPLANT
SUT VIC AB 3-0 SH 27 (SUTURE) ×1
SUT VIC AB 3-0 SH 27X BRD (SUTURE) ×1 IMPLANT
SYR CONTROL 10ML LL (SYRINGE) ×1 IMPLANT
TOWEL GREEN STERILE FF (TOWEL DISPOSABLE) ×1 IMPLANT
TRAY FAXITRON CT DISP (TRAY / TRAY PROCEDURE) ×1 IMPLANT
TUBE CONNECTING 20X1/4 (TUBING) IMPLANT
YANKAUER SUCT BULB TIP NO VENT (SUCTIONS) IMPLANT

## 2023-04-03 NOTE — Op Note (Signed)
Preoperative diagnosis: Right nipple discharge, right retroareolar mass consistent with a papilloma Postoperative diagnosis: Same as above Procedure: Right breast radioactive seed guided excisional biopsy Surgeon: Dr. Harden Mo Anesthesia: General Estimated blood loss: Minimal Complications: None Drains: None Specimens: Right breast tissue containing seed and clip marked with paint sent to pathology Sponge needle count was correct at completion Disposition to recovery stable condition  Indications: This is a 67 year old female with significant breast pain.  I do not think this is related to her current issues that she had a mammogram that noted a possible left breast mass.  Ultrasound showed a 8 mm mass at 2:00 and had another 7 mm mass at 2:00.  Both of these were biopsied.  They both returned as benign mammary parenchyma.  These were concordant with imaging.  After that she then noticed some spontaneous nonbloody right nipple discharge and was reevaluated.  An ultrasound then showed a dilated duct at 6:00 in the retroareolar position with a possible intraductal mass measuring 1.6 cm.  This underwent a biopsy was a fragment of sclerosed intraductal papilloma.  We discussed her options and elected to proceed with a seed guided excision of the papilloma.  We discussed that this would not resolve the breast pain.  Procedure: After informed consent was obtained she was taken to the operating room.  She was given antibiotics.  SCDs were placed.  She was placed under general anesthesia without complication.  She was prepped and draped in the standard sterile surgical fashion.  A surgical timeout was then performed.  The seed was in the retroareolar position.  I made an inferior periareolar incision in order to hide the scar later.  I infiltrated Marcaine throughout this area.  I then used the neoprobe to dissect to the seed.  This was immediately behind the nipple and the areola.  I remove the seed  and some of the surrounding tissue.  Mammogram confirmed removal of the seed and the clip.  I then obtained hemostasis.  I closed the breast tissue with 2-0 Vicryl.  The skin was closed with 3-0 Vicryl and 5-0 Monocryl.  Glue and Steri-Strips were applied.  She tolerated well was extubated transferred to recovery stable.

## 2023-04-03 NOTE — Transfer of Care (Signed)
Immediate Anesthesia Transfer of Care Note  Patient: Katelyn Dennis  Procedure(s) Performed: RADIOACTIVE SEED GUIDED EXCISIONAL RIGHT BREAST BIOPSY (Right: Breast)  Patient Location: PACU  Anesthesia Type:General  Level of Consciousness: awake, alert , and oriented  Airway & Oxygen Therapy: Patient Spontanous Breathing and Patient connected to face mask oxygen  Post-op Assessment: Report given to RN and Post -op Vital signs reviewed and stable  Post vital signs: Reviewed and stable  Last Vitals:  Vitals Value Taken Time  BP 98/52 04/03/23 1341  Temp    Pulse 71 04/03/23 1341  Resp 12 04/03/23 1342  SpO2 100 % 04/03/23 1341  Vitals shown include unvalidated device data.  Last Pain:  Vitals:   04/03/23 1158  TempSrc: Oral  PainSc: 0-No pain      Patients Stated Pain Goal: 3 (04/03/23 1158)  Complications: No notable events documented.

## 2023-04-03 NOTE — Interval H&P Note (Signed)
History and Physical Interval Note:  04/03/2023 12:31 PM  Katelyn Dennis  has presented today for surgery, with the diagnosis of RIGHT BREAST MASS.  The various methods of treatment have been discussed with the patient and family. After consideration of risks, benefits and other options for treatment, the patient has consented to  Procedure(s): RADIOACTIVE SEED GUIDED EXCISIONAL RIGHT BREAST BIOPSY (Right) as a surgical intervention.  The patient's history has been reviewed, patient examined, no change in status, stable for surgery.  I have reviewed the patient's chart and labs.  Questions were answered to the patient's satisfaction.     [name]

## 2023-04-03 NOTE — Anesthesia Procedure Notes (Signed)
Procedure Name: LMA Insertion Date/Time: 04/03/2023 1:08 PM  Performed by: Cleda Clarks, CRNAPre-anesthesia Checklist: Patient identified, Emergency Drugs available, Suction available and Patient being monitored Patient Re-evaluated:Patient Re-evaluated prior to induction Oxygen Delivery Method: Circle system utilized Preoxygenation: Pre-oxygenation with 100% oxygen Induction Type: IV induction Ventilation: Mask ventilation without difficulty LMA: LMA inserted LMA Size: 3.0 Number of attempts: 1 Placement Confirmation: positive ETCO2 Tube secured with: Tape Dental Injury: Teeth and Oropharynx as per pre-operative assessment

## 2023-04-03 NOTE — H&P (Signed)
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 She has no family history. She has significant breast pain on both sides. She is on estrogen and progesterone right now. She was evaluated with a mammogram for the pain and a possible breast mass on the left. She was noted to have C density breast tissue. She had a mammogram that was negative. She underwent an ultrasound that showed a well-circumscribed hypoechoic mass at 2:00 4 cm from the nipple in the left breast measuring 8 x 3 x 7 mm. There is another 1 at 2:00 7 cm from the nipple measuring 7 x 3 x 4 mm. The left axilla was negative. She was given the option of short-term follow-up versus a biopsy. These 2 areas on the left side were both biopsied. They both returned as benign mammary parenchyma with fibroadenomatoid changes. These were concordant with the imaging. After that she also noticed some spontaneous nonbloody right nipple discharge mostly on her sheets. She was then evaluated again. An ultrasound was done showing a dilated duct at 6:00 in the retroareolar position with a possible intraductal mass measuring 1.6 x 0.9 x 1.5 cm. This underwent a biopsy and was fragments of sclerosed intraductal papilloma. She is then referred for evaluation. Her major complaint is the pain and tenderness that she has. There is significant sensitivity to both the seatbelt and even having a comforter.  Review of Systems: A complete review of systems was obtained from the patient. I have reviewed this information and discussed as appropriate with the patient. See HPI as well for other ROS.  Review of Systems Gastrointestinal: Positive for heartburn. All other systems reviewed and are negative.   Medical History: Past Medical History: Diagnosis Date Arthritis CREST syndrome (CMS/HHS-HCC) GERD (gastroesophageal reflux disease) Hormone replacement therapy Hypertension  Patient Active Problem List Diagnosis Vitamin D  deficiency Gastroesophageal reflux disease without esophagitis CREST (calcinosis, Raynaud's phenomenon, esophageal dysfunction, sclerodactyly, telangiectasia) (CMS/HHS-HCC) Esophageal dysphagia Abnormal barium swallow Schatzki's ring Hiatal hernia Personal history of colonic polyps Change in bowel habits  Past Surgical History: Procedure Laterality Date COLONOSCOPY 09/26/2021 Diverticulosis/Int. Hem./Otherwise normal colon/PHx CP/10 yrs EGD 09/26/2021 Schatzki ring.Dilated/Otherwise normal EGD athroscopy COLONOSCOPY Fibroadenoma excision x2 PELVIC LAPAROSCOPY to look at ovarian cyst TONSILLECTOMY UPPER GASTROINTESTINAL ENDOSCOPY   Allergies Allergen Reactions Iodine Rash Minocycline Vomiting  Current Outpatient Medications on File Prior to Visit Medication Sig Dispense Refill acetaminophen (TYLENOL 8 HOUR ORAL) Take by mouth once daily as needed amLODIPine (NORVASC) 2.5 MG tablet Take 1 tablet (2.5 mg total) by mouth once daily 90 tablet 2 cranberry fruit extract (CRANBERRY EXTRACT ORAL) Take 450 mg by mouth once daily estradioL (ESTRACE) 2 MG tablet TAKE ONE TABLET BY MOUTH ONE TIME DAILY 90 tablet 3 ibuprofen (MOTRIN) 200 MG tablet Take 200 mg by mouth every 6 (six) hours as needed for Pain pantoprazole (PROTONIX) 40 MG DR tablet Take 1 tablet (40 mg total) by mouth once daily for 90 days 90 tablet 0 pilocarpine (SALAGEN) 5 mg tablet Take 1 tablet (5 mg total) by mouth 3 (three) times daily 90 tablet 11 progesterone (PROMETRIUM) 100 MG capsule Take 1 capsule (100 mg total) by mouth at bedtime 90 capsule 3 tolterodine (DETROL LA) 4 MG LA capsule TAKE ONE CAPSULE BY MOUTH ONE TIME DAILY 30 capsule 11  No current facility-administered medications on file prior to visit.  Family History Problem Relation Age of Onset Arthritis Father Heart disease Father Diabetes Brother Myasthenia gravis Brother   Social History  Tobacco Use  Smoking Status Never Smokeless  Tobacco Never   Social History  Socioeconomic History Marital status: Married Occupational History Occupation: Retired Tobacco Use Smoking status: Never Smokeless tobacco: Never Vaping Use Vaping Use: Never used Substance and Sexual Activity Alcohol use: Yes Comment: occasional wine Drug use: Never Sexual activity: Yes Birth control/protection: Post-menopausal  Objective:  Vitals: 02/26/23 1328 02/26/23 1337 BP: 132/73 Pulse: 71 Temp: 36.3 C (97.3 F) SpO2: 98% Weight: 54.2 kg (119 lb 6.4 oz) Height: 153.7 cm (5' 0.5") PainSc: 1  Body mass index is 22.93 kg/m.  Physical Exam Vitals reviewed. Chest: Breasts: Right: Tenderness present. No inverted nipple, mass or nipple discharge. Left: Tenderness present. No inverted nipple, mass or nipple discharge. Lymphadenopathy: Upper Body: Right upper body: No supraclavicular or axillary adenopathy. Left upper body: No supraclavicular or axillary adenopathy.    Assessment and Plan:  Diagnoses and all orders for this visit:  Intraductal papilloma of breast, right  There are 3 issues today. The first is the breast pain and tenderness. I do not think this is related to anything that is recently been found on imaging. I gave her a list of instructions on things she might be able to do. This was related to a variety of things including well-fitting support. There is also a number of different things you can use such as evening primrose oil and different vitamins that I gave her today. She is going to review this and attempt some of these. The next issue are the left side fibroadenomatoid areas. These are concordant. She was concerned that these might be the source of her pain. I do not think the small masses have anything to do with the tenderness she is currently feeling and I would be fine and watching this as they are concordant. The third issue is the nipple discharge and the 1.6 cm retroareolar mass that was biopsied as a  papilloma. I do think with the discharge and the mass that the recommendation would be to excise this area. I discussed a seed guided excisional biopsy. We also discussed following this as well as a small chance we could be missing something if we do that. She is going to consider her options and will call back.

## 2023-04-03 NOTE — Discharge Instructions (Addendum)
[facility] Office Phone Number [phone]  POST OP INSTRUCTIONS Take 400 mg of ibuprofen every 8 hours or 650 mg tylenol every 6 hours for next 72 hours then as needed. Use ice several times daily also. No Tylenol until after 6:00pm today, if needed. A prescription for pain medication may be given to you upon discharge.  Take your pain medication as prescribed, if needed.  If narcotic pain medicine is not needed, then you may take acetaminophen (Tylenol), naprosyn (Alleve) or ibuprofen (Advil) as needed. Take your usually prescribed medications unless otherwise directed If you need a refill on your pain medication, please contact your pharmacy.  They will contact our office to request authorization.  Prescriptions will not be filled after 5pm or on week-ends. You should eat very light the first 24 hours after surgery, such as soup, crackers, pudding, etc.  Resume your normal diet the day after surgery. Most patients will experience some swelling and bruising in the breast.  Ice packs and a good support bra will help.  Wear the breast binder provided or a sports bra for 72 hours day and night.  After that wear a sports bra during the day until you return to the office. Swelling and bruising can take several days to resolve.  It is common to experience some constipation if taking pain medication after surgery.  Increasing fluid intake and taking a stool softener will usually help or prevent this problem from occurring.  A mild laxative (Milk of Magnesia or Miralax) should be taken according to package directions if there are no bowel movements after 48 hours. I used skin glue on the incision, you may shower in 24 hours.  The glue will flake off over the next 2-3 weeks.  Any sutures or staples will be removed at the office during your follow-up visit. ACTIVITIES:  You may resume regular daily activities (gradually increasing) beginning the next day.  Wearing a good support bra or sports  bra minimizes pain and swelling.  You may have sexual intercourse when it is comfortable. You may drive when you no longer are taking prescription pain medication, you can comfortably wear a seatbelt, and you can safely maneuver your car and apply brakes. RETURN TO WORK:  ______________________________________________________________________________________ Katelyn Dennis should see your doctor in the office for a follow-up appointment approximately two weeks after your surgery.  Your doctor's nurse will typically make your follow-up appointment when she calls you with your pathology report.  Expect your pathology report 3-4 business days after your surgery.  You may call to check if you do not hear from [place] after three days. OTHER INSTRUCTIONS: _______________________________________________________________________________________________ _____________________________________________________________________________________________________________________________________ _____________________________________________________________________________________________________________________________________ _____________________________________________________________________________________________________________________________________  WHEN TO CALL DR WAKEFIELD: Fever over 101.0 Nausea and/or vomiting. Extreme swelling or bruising. Continued bleeding from incision. Increased pain, redness, or drainage from the incision.  The clinic staff is available to answer your questions during regular business hours.  Please don't hesitate to call and ask to speak to one of the nurses for clinical concerns.  If you have a medical emergency, go to the nearest emergency room or call 911.  A surgeon from [facility] is always on call at the hospital.  For further questions, please visit [email or web address]  Post Anesthesia Home Care Instructions  Activity: Get plenty of rest for the remainder of  the day. A responsible individual must stay with you for 24 hours following the procedure.  For the next 24 hours, DO NOT: -Drive a car -Advertising copywriter -Drink alcoholic beverages -  Take any medication unless instructed by your physician -Make any legal decisions or sign important papers.  Meals: Start with liquid foods such as gelatin or soup. Progress to regular foods as tolerated. Avoid greasy, spicy, heavy foods. If nausea and/or vomiting occur, drink only clear liquids until the nausea and/or vomiting subsides. Call your physician if vomiting continues.  Special Instructions/Symptoms: Your throat may feel dry or sore from the anesthesia or the breathing tube placed in your throat during surgery. If this causes discomfort, gargle with warm salt water. The discomfort should disappear within 24 hours.  If you had a scopolamine patch placed behind your ear for the management of post- operative nausea and/or vomiting:  1. The medication in the patch is effective for 72 hours, after which it should be removed.  Wrap patch in a tissue and discard in the trash. Wash hands thoroughly with soap and water. 2. You may remove the patch earlier than 72 hours if you experience unpleasant side effects which may include dry mouth, dizziness or visual disturbances. 3. Avoid touching the patch. Wash your hands with soap and water after contact with the patch.

## 2023-04-03 NOTE — Anesthesia Preprocedure Evaluation (Addendum)
Anesthesia Evaluation  Patient identified by MRN, date of birth, ID band Patient awake    Reviewed: Allergy & Precautions, NPO status , Patient's Chart, lab work & pertinent test results  History of Anesthesia Complications Negative for: history of anesthetic complications  Airway Mallampati: II  TM Distance: >3 FB Neck ROM: Full    Dental no notable dental hx.    Pulmonary neg pulmonary ROS   Pulmonary exam normal        Cardiovascular hypertension, Pt. on medications Normal cardiovascular exam     Neuro/Psych    GI/Hepatic Neg liver ROS, hiatal hernia,GERD  Medicated,,  Endo/Other  negative endocrine ROS    Renal/GU negative Renal ROS     Musculoskeletal  (+) Arthritis ,    Abdominal   Peds  Hematology negative hematology ROS (+)   Anesthesia Other Findings RIGHT BREAST MASS, CREST syndrome  Reproductive/Obstetrics                              Anesthesia Physical Anesthesia Plan  ASA: 2  Anesthesia Plan: General   Post-op Pain Management: Tylenol PO (pre-op)*   Induction: Intravenous  PONV Risk Score and Plan: 3 and Midazolam, Treatment may vary due to age or medical condition, Dexamethasone and Ondansetron  Airway Management Planned: LMA  Additional Equipment: None  Intra-op Plan:   Post-operative Plan: Extubation in OR  Informed Consent: I have reviewed the patients History and Physical, chart, labs and discussed the procedure including the risks, benefits and alternatives for the proposed anesthesia with the patient or authorized representative who has indicated his/her understanding and acceptance.     Dental advisory given  Plan Discussed with: CRNA  Anesthesia Plan Comments:         Anesthesia Quick Evaluation

## 2023-04-03 NOTE — Anesthesia Postprocedure Evaluation (Signed)
Anesthesia Post Note  Patient: Arly Gengler  Procedure(s) Performed: RADIOACTIVE SEED GUIDED EXCISIONAL RIGHT BREAST BIOPSY (Right: Breast)     Patient location during evaluation: PACU Anesthesia Type: General Level of consciousness: awake and alert Pain management: pain level controlled Vital Signs Assessment: post-procedure vital signs reviewed and stable Respiratory status: spontaneous breathing, nonlabored ventilation and respiratory function stable Cardiovascular status: blood pressure returned to baseline Postop Assessment: no apparent nausea or vomiting Anesthetic complications: no   No notable events documented.  Last Vitals:  Vitals:   04/03/23 1345 04/03/23 1400  BP: (!) 99/51 (!) 114/58  Pulse:  76  Resp: 11 17  Temp:    SpO2: 100% 100%    Last Pain:  Vitals:   04/03/23 1400  TempSrc:   PainSc: 0-No pain                 Shanda Howells

## 2023-04-04 ENCOUNTER — Encounter (HOSPITAL_BASED_OUTPATIENT_CLINIC_OR_DEPARTMENT_OTHER): Payer: Self-pay | Admitting: General Surgery

## 2023-04-11 LAB — SURGICAL PATHOLOGY

## 2023-05-01 ENCOUNTER — Ambulatory Visit (INDEPENDENT_AMBULATORY_CARE_PROVIDER_SITE_OTHER): Payer: Medicare Other | Admitting: Dermatology

## 2023-05-01 DIAGNOSIS — Z7189 Other specified counseling: Secondary | ICD-10-CM

## 2023-05-01 DIAGNOSIS — Z79899 Other long term (current) drug therapy: Secondary | ICD-10-CM

## 2023-05-01 DIAGNOSIS — L820 Inflamed seborrheic keratosis: Secondary | ICD-10-CM

## 2023-05-01 DIAGNOSIS — L298 Other pruritus: Secondary | ICD-10-CM | POA: Diagnosis not present

## 2023-05-01 DIAGNOSIS — W57XXXA Bitten or stung by nonvenomous insect and other nonvenomous arthropods, initial encounter: Secondary | ICD-10-CM

## 2023-05-01 MED ORDER — CLOBETASOL PROPIONATE 0.05 % EX CREA: TOPICAL_CREAM | CUTANEOUS | 1 refills | Status: AC

## 2023-05-01 MED ORDER — MUPIROCIN 2 % EX OINT: TOPICAL_OINTMENT | CUTANEOUS | 2 refills | Status: AC

## 2023-05-01 NOTE — Progress Notes (Signed)
   Follow-Up Visit   Subjective  Katelyn Dennis is a 67 y.o. female who presents for the following: The patient c/o itchy area on her back for ~4 days treating with otc Benadryl cream with a poor response. .  The patient has spots, moles and lesions to be evaluated, some may be new or changing and the patient may have concern these could be cancer.  The following portions of the chart were reviewed this encounter and updated as appropriate: medications, allergies, medical history  Review of Systems:  No other skin or systemic complaints except as noted in HPI or Assessment and Plan.  Objective  Well appearing patient in no apparent distress; mood and affect are within normal limits. A focused examination was performed of the following areas: Relevant exam findings are noted in the Assessment and Plan.  back x 3 (3) Stuck-on, waxy, tan-brown papules--Discussed benign etiology and prognosis.    Assessment & Plan   Inflamed seborrheic keratosis (3) back x 3  Symptomatic, irritating, patient would like treated.   Destruction of lesion - back x 3 Complexity: simple   Destruction method: cryotherapy   Informed consent: discussed and consent obtained   Timeout:  patient name, date of birth, surgical site, and procedure verified Lesion destroyed using liquid nitrogen: Yes   Region frozen until ice ball extended beyond lesion: Yes   Outcome: patient tolerated procedure well with no complications   Post-procedure details: wound care instructions given    BITES  Back- 3 small crusted red papules   Start Clobetasol cream apply to itchy areas on the back twice a day x 2 weeks  Start Mupirocin ointment apply to scabs or infected areas of the skin 2-3 times a day   Topical steroids (such as triamcinolone, fluocinolone, fluocinonide, mometasone, clobetasol, halobetasol, betamethasone, hydrocortisone) can cause thinning and lightening of the skin if they are used for too long in the same  area. Your physician has selected the right strength medicine for your problem and area affected on the body. Please use your medication only as directed by your physician to prevent side effects.    Return if symptoms worsen or fail to improve.  IAngelique Holm, CMA, am acting as scribe for Armida Sans, MD .   Documentation: I have reviewed the above documentation for accuracy and completeness, and I agree with the above.  Armida Sans, MD

## 2023-05-01 NOTE — Patient Instructions (Addendum)
Cryotherapy Aftercare  Wash gently with soap and water everyday.   Apply Vaseline and Band-Aid daily until healed.     Due to recent changes in healthcare laws, you may see results of your pathology and/or laboratory studies on MyChart before the doctors have had a chance to review them. We understand that in some cases there may be results that are confusing or concerning to you. Please understand that not all results are received at the same time and often the doctors may need to interpret multiple results in order to provide you with the best plan of care or course of treatment. Therefore, we ask that you please give us 2 business days to thoroughly review all your results before contacting the office for clarification. Should we see a critical lab result, you will be contacted sooner.   If You Need Anything After Your Visit  If you have any questions or concerns for your doctor, please call our main line at [phone] and press option 4 to reach your doctor's medical assistant. If no one answers, please leave a voicemail as directed and we will return your call as soon as possible. Messages left after 4 pm will be answered the following business day.   You may also send us a message via MyChart. We typically respond to MyChart messages within 1-2 business days.  For prescription refills, please ask your pharmacy to contact our office. Our fax number is [phone].  If you have an urgent issue when the clinic is closed that cannot wait until the next business day, you can page your doctor at the number below.    Please note that while we do our best to be available for urgent issues outside of office hours, we are not available 24/7.   If you have an urgent issue and are unable to reach us, you may choose to seek medical care at your doctor's office, retail clinic, urgent care center, or emergency room.  If you have a medical emergency, please immediately call 911 or go to the  emergency department.  Pager Numbers  - Dr. Kowalski: [phone]  - Dr. Moye: [phone]  - Dr. Stewart: [phone]  In the event of inclement weather, please call our main line at [phone] for an update on the status of any delays or closures.  Dermatology Medication Tips: Please keep the boxes that topical medications come in in order to help keep track of the instructions about where and how to use these. Pharmacies typically print the medication instructions only on the boxes and not directly on the medication tubes.   If your medication is too expensive, please contact our office at [phone] option 4 or send us a message through MyChart.   We are unable to tell what your co-pay for medications will be in advance as this is different depending on your insurance coverage. However, we may be able to find a substitute medication at lower cost or fill out paperwork to get insurance to cover a needed medication.   If a prior authorization is required to get your medication covered by your insurance company, please allow us 1-2 business days to complete this process.  Drug prices often vary depending on where the prescription is filled and some pharmacies may offer cheaper prices.  The website www.goodrx.com contains coupons for medications through different pharmacies. The prices here do not account for what the cost may be with help from insurance (it may be cheaper with your insurance), but the website can   give you the price if you did not use any insurance.  - You can print the associated coupon and take it with your prescription to the pharmacy.  - You may also stop by our office during regular business hours and pick up a GoodRx coupon card.  - If you need your prescription sent electronically to a different pharmacy, notify our office through [place] MyChart or by phone at [phone] option 4.     Si Usted Necesita Algo Despus de Su Visita  Tambin puede  enviarnos un mensaje a travs de MyChart. Por lo general respondemos a los mensajes de MyChart en el transcurso de 1 a 2 das hbiles.  Para renovar recetas, por favor pida a su farmacia que se ponga en contacto con nuestra oficina. Nuestro nmero de fax es el [phone].  Si tiene un asunto urgente cuando la clnica est cerrada y que no puede esperar hasta el siguiente da hbil, puede llamar/localizar a su doctor(a) al nmero que aparece a continuacin.   Por favor, tenga en cuenta que aunque hacemos todo lo posible para estar disponibles para asuntos urgentes fuera del horario de oficina, no estamos disponibles las 24 horas del da, los 7 das de la semana.   Si tiene un problema urgente y no puede comunicarse con nosotros, puede optar por buscar atencin mdica  en el consultorio de su doctor(a), en una clnica privada, en un centro de atencin urgente o en una sala de emergencias.  Si tiene una emergencia mdica, por favor llame inmediatamente al 911 o vaya a la sala de emergencias.  Nmeros de bper  - Dr. Kowalski: [phone]  - Dra. Moye: [phone]  - Dra. Stewart: [phone]  En caso de inclemencias del tiempo, por favor llame a nuestra lnea principal al [phone] para una actualizacin sobre el estado de cualquier retraso o cierre.  Consejos para la medicacin en dermatologa: Por favor, guarde las cajas en las que vienen los medicamentos de uso tpico para ayudarle a seguir las instrucciones sobre dnde y cmo usarlos. Las farmacias generalmente imprimen las instrucciones del medicamento slo en las cajas y no directamente en los tubos del medicamento.   Si su medicamento es muy caro, por favor, pngase en contacto con nuestra oficina llamando al [phone] y presione la opcin 4 o envenos un mensaje a travs de MyChart.   No podemos decirle cul ser su copago por los medicamentos por adelantado ya que esto es diferente dependiendo de la cobertura de su seguro.  Sin embargo, es posible que podamos encontrar un medicamento sustituto a menor costo o llenar un formulario para que el seguro cubra el medicamento que se considera necesario.   Si se requiere una autorizacin previa para que su compaa de seguros cubra su medicamento, por favor permtanos de 1 a 2 das hbiles para completar este proceso.  Los precios de los medicamentos varan con frecuencia dependiendo del lugar de dnde se surte la receta y alguna farmacias pueden ofrecer precios ms baratos.  El sitio web www.goodrx.com tiene cupones para medicamentos de diferentes farmacias. Los precios aqu no tienen en cuenta lo que podra costar con la ayuda del seguro (puede ser ms barato con su seguro), pero el sitio web puede darle el precio si no utiliz ningn seguro.  - Puede imprimir el cupn correspondiente y llevarlo con su receta a la farmacia.  - Tambin puede pasar por nuestra oficina durante el horario de atencin regular y recoger una tarjeta de cupones de GoodRx.  -   Si necesita que su receta se enve electrnicamente a una farmacia diferente, informe a nuestra oficina a travs de MyChart de [place] o por telfono llamando al [phone] y presione la opcin 4.  

## 2023-05-03 ENCOUNTER — Encounter: Payer: Self-pay | Admitting: Dermatology

## 2023-06-12 ENCOUNTER — Telehealth: Payer: Self-pay

## 2023-06-12 MED ORDER — VALACYCLOVIR HCL 500 MG PO TABS: ORAL_TABLET | ORAL | 0 refills | Status: DC

## 2023-06-12 NOTE — Telephone Encounter (Signed)
Left message on voicemail to return my call. Patient scheduled for lip and oral commissure fillers Valtrex protocol sent to pharmacy. Patient also needs to come 15 minutes early to apply numbing medication to the lips.

## 2023-06-13 ENCOUNTER — Telehealth: Payer: Self-pay

## 2023-06-13 ENCOUNTER — Ambulatory Visit (INDEPENDENT_AMBULATORY_CARE_PROVIDER_SITE_OTHER): Payer: Medicare Other | Admitting: Dermatology

## 2023-06-13 DIAGNOSIS — S20469D Insect bite (nonvenomous) of unspecified back wall of thorax, subsequent encounter: Secondary | ICD-10-CM

## 2023-06-13 DIAGNOSIS — I781 Nevus, non-neoplastic: Secondary | ICD-10-CM

## 2023-06-13 DIAGNOSIS — L988 Other specified disorders of the skin and subcutaneous tissue: Secondary | ICD-10-CM | POA: Diagnosis not present

## 2023-06-13 DIAGNOSIS — L820 Inflamed seborrheic keratosis: Secondary | ICD-10-CM | POA: Diagnosis not present

## 2023-06-13 DIAGNOSIS — Z79899 Other long term (current) drug therapy: Secondary | ICD-10-CM

## 2023-06-13 DIAGNOSIS — W57XXXD Bitten or stung by nonvenomous insect and other nonvenomous arthropods, subsequent encounter: Secondary | ICD-10-CM

## 2023-06-13 DIAGNOSIS — W908XXA Exposure to other nonionizing radiation, initial encounter: Secondary | ICD-10-CM

## 2023-06-13 DIAGNOSIS — L821 Other seborrheic keratosis: Secondary | ICD-10-CM

## 2023-06-13 DIAGNOSIS — L578 Other skin changes due to chronic exposure to nonionizing radiation: Secondary | ICD-10-CM

## 2023-06-13 DIAGNOSIS — I8393 Asymptomatic varicose veins of bilateral lower extremities: Secondary | ICD-10-CM

## 2023-06-13 DIAGNOSIS — L814 Other melanin hyperpigmentation: Secondary | ICD-10-CM

## 2023-06-13 NOTE — Patient Instructions (Addendum)
BEFORE YOUR APPOINTMENT FOR SCLEROTHERAPY  1. When you telephone for your appointment for the sclerotherapy procedure, please let the receptionist know that you are scheduling for the fifteen (15) minute sclerotherapy procedure not just a regular visit.  2. On the day of the procedure, please cleanse and dry the areas, but do not use any moisturizers or other products on the area(s) to be treated.  3. Bring a pair of comfortable shorts to wear during the procedure.  4. Be sure to bring your recommended graduated compression stockings with you to the office. You will be wearing them home when your visit is over. These compression hose can be purchased at most medical supply stores.  After Your Sclerotherapy Procedure  1. Please wear the graduated compression stockings for 24 hours immediately following the completion of the sclerotherapy procedure.  2. We recommend that you avoid vigorous activity as much as possible for the first twenty-four (24) hours. You can do your "normal" routine, but avoid an above normal amount of time on your feet. Elevating the legs when sitting and avoidance of vigorous leg movements or exercise in the first few days after treatment may improve your results.  3. You may remove the compression dressings (cotton balls) and tape the next morning.  4. Please continue wearing the compression stockings during waking hours for the two (2) weeks following sclerotherapy.  5. If you have any blisters, sores or ulcers or other problems following your procedure please call or return to the office immediately.     THE PROCEDURE FEE IS $350.00 PER FIFTEEN (15) MINUTE SESSION. WE REQUIRE THAT THIS PROCEDURE BE PAID FOR IN FULL ON OR BEFORE THE DATE THAT IT IS PERFORMED. WE WILL GIVE YOU A RECEIPT THAT YOU CAN FILE WITH YOUR INSURANCE COMPANY. WE GENERALLY DO NOT FILE THIS PROCEDURE WITH ANY INSURANCE COMPANY EXCEPT UNDER CERTAIN CIRCUMSTANCES WHERE PRIOR AUTHORIZATION HAS BEEN  CONFIRMED. THIS PROCEDURE IS GENERALLY CONSIDERED TO BE A COSMETIC PROCEDURE BY INSURANCE COMPANIES.    Due to recent changes in healthcare laws, you may see results of your pathology and/or laboratory studies on MyChart before the doctors have had a chance to review them. We understand that in some cases there may be results that are confusing or concerning to you. Please understand that not all results are received at the same time and often the doctors may need to interpret multiple results in order to provide you with the best plan of care or course of treatment. Therefore, we ask that you please give [place] 2 business days to thoroughly review all your results before contacting the office for clarification. Should we see a critical lab result, you will be contacted sooner.   If You Need Anything After Your Visit  If you have any questions or concerns for your doctor, please call our main line at [phone] and press option 4 to reach your doctor's medical assistant. If no one answers, please leave a voicemail as directed and we will return your call as soon as possible. Messages left after 4 pm will be answered the following business day.   You may also send [place] a message via [place]. We typically respond to MyChart messages within 1-2 business days.  For prescription refills, please ask your pharmacy to contact our office. Our fax number is [phone].  If you have an urgent issue when the clinic is closed that cannot wait until the next business day, you can page your doctor at the number below.  Please note that while we do our best to be available for urgent issues outside of office hours, we are not available 24/7.   If you have an urgent issue and are unable to reach [place], you may choose to seek medical care at your doctor's office, retail clinic, urgent care center, or emergency room.  If you have a medical emergency, please immediately call 911 or go to the emergency  department.  Pager Numbers  - Dr. Nehemiah Massed: [phone]  - Dr. Laurence Ferrari: [phone]  - Dr. Nicole Kindred: [phone]  In the event of inclement weather, please call our main line at [phone] for an update on the status of any delays or closures.  Dermatology Medication Tips: Please keep the boxes that topical medications come in in order to help keep track of the instructions about where and how to use these. Pharmacies typically print the medication instructions only on the boxes and not directly on the medication tubes.   If your medication is too expensive, please contact our office at [phone] option 4 or send [place] a message through [place].   We are unable to tell what your co-pay for medications will be in advance as this is different depending on your insurance coverage. However, we may be able to find a substitute medication at lower cost or fill out paperwork to get insurance to cover a needed medication.   If a prior authorization is required to get your medication covered by your insurance company, please allow [place] 1-2 business days to complete this process.  Drug prices often vary depending on where the prescription is filled and some pharmacies may offer cheaper prices.  The website www.goodrx.com contains coupons for medications through different pharmacies. The prices here do not account for what the cost may be with help from insurance (it may be cheaper with your insurance), but the website can give you the price if you did not use any insurance.  - You can print the associated coupon and take it with your prescription to the pharmacy.  - You may also stop by our office during regular business hours and pick up a GoodRx coupon card.  - If you need your prescription sent electronically to a different pharmacy, notify our office through [facility] or by phone at [phone] option 4.     Si Usted Necesita Algo Despus de Su Visita  Tambin puede enviarnos un  mensaje a travs de Pharmacist, community. Por lo general respondemos a los mensajes de MyChart en el transcurso de 1 a 2 das hbiles.  Para renovar recetas, por favor pida a su farmacia que se ponga en contacto con nuestra oficina. Harland Dingwall de fax es Central [phone].  Si tiene un asunto urgente cuando la clnica est cerrada y que no puede esperar hasta el siguiente da hbil, puede llamar/localizar a su doctor(a) al nmero que aparece a continuacin.   Por favor, tenga en cuenta que aunque hacemos todo lo posible para estar disponibles para asuntos urgentes fuera del horario de [place], no estamos disponibles las 24 horas del da, los 7 das de la [place].   Si tiene un problema urgente y no puede comunicarse con nosotros, puede optar por buscar atencin mdica  en el consultorio de su doctor(a), en una clnica privada, en un centro de atencin urgente o en una sala de emergencias.  Si tiene Engineering geologist, por favor llame inmediatamente al 911 o vaya a la sala de emergencias.  Nmeros de bper  - Dr. Nehemiah Massed:  [phone]  - Dra. Moye: [phone]  - Dra. Nicole Kindred: [phone]  En caso de inclemencias del [place], por favor llame a Johnsie Kindred principal al [phone] para una actualizacin sobre el [place] de cualquier retraso o cierre.  Consejos para la medicacin en dermatologa: Por favor, guarde las cajas en las que vienen los medicamentos de uso tpico para ayudarle a seguir las instrucciones sobre dnde y cmo usarlos. Las farmacias generalmente imprimen las instrucciones del medicamento slo en las cajas y no directamente en los tubos del [place].   Si su medicamento es muy caro, por favor, pngase en contacto con Zigmund Daniel llamando al [phone] y presione la opcin 4 o envenos un mensaje a travs de Pharmacist, community.   No podemos decirle cul ser su copago por los medicamentos por adelantado ya que esto es diferente dependiendo de la cobertura de su seguro. Sin embargo,  es posible que podamos encontrar un medicamento sustituto a Electrical engineer un formulario para que el seguro cubra el medicamento que se considera necesario.   Si se requiere una autorizacin previa para que su compaa de seguros [place] su medicamento, por favor permtanos de 1 a 2 das hbiles para completar este proceso.  Los precios de los medicamentos varan con frecuencia dependiendo del Environmental consultant de dnde se surte la receta y alguna farmacias pueden ofrecer precios ms baratos.  El sitio web www.goodrx.com tiene cupones para medicamentos de Airline pilot. Los precios aqu no tienen en cuenta lo que podra costar con la ayuda del seguro (puede ser ms barato con su seguro), pero el sitio web puede darle el precio si no utiliz Research scientist (physical sciences).  - Puede imprimir el cupn correspondiente y llevarlo con su receta a la farmacia.  - Tambin puede pasar por nuestra oficina durante el horario de atencin regular y Charity fundraiser una tarjeta de cupones de GoodRx.  - Si necesita que su receta se enve electrnicamente a una farmacia diferente, informe a nuestra oficina a travs de MyChart de [place] o por telfono llamando al [phone] y presione la opcin 4.

## 2023-06-13 NOTE — Telephone Encounter (Signed)
Left message on voicemail to return my call. See previous telephone call message.

## 2023-06-13 NOTE — Progress Notes (Signed)
Follow-Up Visit   Subjective  Katelyn Dennis is a 67 y.o. female who presents for the following: Botox for facial elastosis. She would also like her back checked, because even though ISKs were treated with LN2 last month the spots are still itching. The patient has spots, moles and lesions to be evaluated, some may be new or changing.  The following portions of the chart were reviewed this encounter and updated as appropriate: medications, allergies, medical history  Review of Systems:  No other skin or systemic complaints except as noted in HPI or Assessment and Plan.  Objective  Well appearing patient in no apparent distress; mood and affect are within normal limits. A focused examination was performed of the face. Relevant physical exam findings are noted in the Assessment and Plan.  Back x 3 (3) Stuck-on, waxy, tan-brown papules--Discussed benign etiology and prognosis.    Assessment & Plan   Inflamed seborrheic keratosis (3) Back x 3  Symptomatic, irritating, patient would like treated.   Destruction of lesion - Back x 3 (3) Complexity: simple   Destruction method: cryotherapy   Informed consent: discussed and consent obtained   Timeout:  patient name, date of birth, surgical site, and procedure verified Lesion destroyed using liquid nitrogen: Yes   Region frozen until ice ball extended beyond lesion: Yes   Outcome: patient tolerated procedure well with no complications   Post-procedure details: wound care instructions given    Actinic skin damage  Lentigo  Seborrheic keratosis  Spider veins  Elastosis of skin  Insect bite of back wall of thorax, unspecified location, subsequent encounter  Medication management  SEBORRHEIC KERATOSIS - Stuck-on, waxy, tan-brown papules and/or plaques  - Benign-appearing - Discussed benign etiology and prognosis. - Observe - Call for any changes  BITES  Back- 3 small crusted red papules    Start Clobetasol cream apply  to itchy areas on the back twice a day x 2 weeks then decrease to QD-BID up to 5d/wk.   Topical steroids (such as triamcinolone, fluocinolone, fluocinonide, mometasone, clobetasol, halobetasol, betamethasone, hydrocortisone) can cause thinning and lightening of the skin if they are used for too long in the same area. Your physician has selected the right strength medicine for your problem and area affected on the body. Please use your medication only as directed by your physician to prevent side effects.    Facial Elastosis                 Botox 24 units injected as marked: - Frown complex 20 units - Upper lip 4 units  Location: See attached image  Informed consent: Discussed risks (infection, pain, bleeding, bruising, swelling, allergic reaction, paralysis of nearby muscles, eyelid droop, double vision, neck weakness, difficulty breathing, headache, undesirable cosmetic result, and need for additional treatment) and benefits of the procedure, as well as the alternatives.  Informed consent was obtained.  Preparation: The area was cleansed with alcohol.  Procedure Details:  Botox was injected into the dermis with a 30-gauge needle. Pressure applied to any bleeding. Ice packs offered for swelling.  Lot Number:  [id number] Expiration:  06/26  Total Units Injected:  24  Plan: Tylenol may be used for headache.  Allow 2 weeks before returning to clinic for additional dosing as needed. Patient will call for any problems.  Varicose Veins/Spider Veins - Dilated blue, purple or red veins at the lower extremities - Reassured - Smaller vessels can be treated by sclerotherapy (a procedure to inject a medicine into the  veins to make them disappear) if desired, but the treatment is not covered by insurance. Larger vessels may be covered if symptomatic and we would refer to vascular surgeon if treatment desired.  ACTINIC DAMAGE - chronic, secondary to cumulative UV radiation exposure/sun  exposure over time - diffuse scaly erythematous macules with underlying dyspigmentation - Recommend daily broad spectrum sunscreen SPF 30+ to sun-exposed areas, reapply every 2 hours as needed.  - Recommend staying in the shade or wearing long sleeves, sun glasses (UVA+UVB protection) and wide brim hats (4-inch brim around the entire circumference of the hat). - Call for new or changing lesions.  LENTIGINES Exam: scattered tan macules Due to sun exposure Treatment Plan: Benign-appearing, observe. Recommend daily broad spectrum sunscreen SPF 30+ to sun-exposed areas, reapply every 2 hours as needed.  Call for any changes  Return in about 4 months (around 10/14/2023) for Botox and lip filler in 3-4 mths; schedule in fall for sclerotherapy.  Maylene Roes, CMA, am acting as scribe for Armida Sans, MD .  Documentation: I have reviewed the above documentation for accuracy and completeness, and I agree with the above.  Armida Sans, MD

## 2023-06-17 ENCOUNTER — Encounter: Payer: Self-pay | Admitting: Dermatology

## 2023-08-15 ENCOUNTER — Encounter: Payer: Self-pay | Admitting: Dermatology

## 2023-08-15 ENCOUNTER — Ambulatory Visit: Payer: Medicare Other | Admitting: Dermatology

## 2023-08-15 VITALS — BP 106/65 | HR 82

## 2023-08-15 DIAGNOSIS — Z872 Personal history of diseases of the skin and subcutaneous tissue: Secondary | ICD-10-CM

## 2023-08-15 DIAGNOSIS — Z86018 Personal history of other benign neoplasm: Secondary | ICD-10-CM

## 2023-08-15 DIAGNOSIS — L578 Other skin changes due to chronic exposure to nonionizing radiation: Secondary | ICD-10-CM | POA: Diagnosis not present

## 2023-08-15 DIAGNOSIS — W908XXA Exposure to other nonionizing radiation, initial encounter: Secondary | ICD-10-CM

## 2023-08-15 DIAGNOSIS — D229 Melanocytic nevi, unspecified: Secondary | ICD-10-CM

## 2023-08-15 DIAGNOSIS — Z1283 Encounter for screening for malignant neoplasm of skin: Secondary | ICD-10-CM

## 2023-08-15 DIAGNOSIS — L738 Other specified follicular disorders: Secondary | ICD-10-CM | POA: Diagnosis not present

## 2023-08-15 DIAGNOSIS — L820 Inflamed seborrheic keratosis: Secondary | ICD-10-CM

## 2023-08-15 DIAGNOSIS — L821 Other seborrheic keratosis: Secondary | ICD-10-CM

## 2023-08-15 DIAGNOSIS — L814 Other melanin hyperpigmentation: Secondary | ICD-10-CM

## 2023-08-15 DIAGNOSIS — D1801 Hemangioma of skin and subcutaneous tissue: Secondary | ICD-10-CM

## 2023-08-15 DIAGNOSIS — L299 Pruritus, unspecified: Secondary | ICD-10-CM

## 2023-08-15 NOTE — Progress Notes (Signed)
Follow-Up Visit   Subjective  Katelyn Dennis is a 67 y.o. female who presents for the following: Skin Cancer Screening and Full Body Skin Exam hx of isks, hx of dysplastic nevi Bump at right forehead  Itchy places at back   The patient presents for Total-Body Skin Exam (TBSE) for skin cancer screening and mole check. The patient has spots, moles and lesions to be evaluated, some may be new or changing and the patient may have concern these could be cancer.    The following portions of the chart were reviewed this encounter and updated as appropriate: medications, allergies, medical history  Review of Systems:  No other skin or systemic complaints except as noted in HPI or Assessment and Plan.  Objective  Well appearing patient in no apparent distress; mood and affect are within normal limits.  A full examination was performed including scalp, head, eyes, ears, nose, lips, neck, chest, axillae, abdomen, back, buttocks, bilateral upper extremities, bilateral lower extremities, hands, feet, fingers, toes, fingernails, and toenails. All findings within normal limits unless otherwise noted below.   Relevant physical exam findings are noted in the Assessment and Plan.  mid back bra line paraspinal  x 2 (2) Erythematous stuck-on, waxy papule or plaque    Assessment & Plan   SKIN CANCER SCREENING PERFORMED TODAY.  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 Call for changes.  ACTINIC DAMAGE - Chronic condition, secondary to cumulative UV/sun exposure - diffuse scaly erythematous macules with underlying dyspigmentation - Recommend daily broad spectrum sunscreen SPF 30+ to sun-exposed areas, reapply every 2 hours as needed.  - Staying in the shade or wearing long sleeves, sun glasses (UVA+UVB protection) and wide brim hats (4-inch brim around the entire circumference of the hat) are also recommended for  sun protection.  - Call for new or changing lesions.  LENTIGINES, SEBORRHEIC KERATOSES, HEMANGIOMAS - Benign normal skin lesions - Benign-appearing - Call for any changes  MELANOCYTIC NEVI - Tan-brown and/or pink-flesh-colored symmetric macules and papules - Benign appearing on exam today - Observation - Call clinic for new or changing moles - Recommend daily use of broad spectrum spf 30+ sunscreen to sun-exposed areas.   HISTORY OF DYSPLASTIC NEVUS Right epigastric moderate to severe 06/23/20 No evidence of recurrence today Recommend regular full body skin exams Recommend daily broad spectrum sunscreen SPF 30+ to sun-exposed areas, reapply every 2 hours as needed.  Call if any new or changing lesions are noted between office visits   Inflamed seborrheic keratosis (2) mid back bra line paraspinal  x 2  Symptomatic, irritating, patient would like treated.  Destruction of lesion - mid back bra line paraspinal  x 2 (2) Complexity: simple   Destruction method: cryotherapy   Informed consent: discussed and consent obtained   Timeout:  patient name, date of birth, surgical site, and procedure verified Lesion destroyed using liquid nitrogen: Yes   Region frozen until ice ball extended beyond lesion: Yes   Outcome: patient tolerated procedure well with no complications   Post-procedure details: wound care instructions given    Actinic skin damage  Skin cancer screening  History of dysplastic nevus  Lentigo  Melanocytic nevus, unspecified location  May use Clobetasol cr on these areas for itch prn.  Return in about 1 year (around 08/14/2024) for TBSE.  Geralynn Rile, CMA, am acting as scribe for Armida Sans, MD.   Documentation: I have reviewed the  above documentation for accuracy and completeness, and I agree with the above.  Armida Sans, MD

## 2023-08-15 NOTE — Patient Instructions (Addendum)

## 2023-12-04 ENCOUNTER — Ambulatory Visit: Payer: TRICARE For Life (TFL) | Admitting: Dermatology

## 2023-12-05 ENCOUNTER — Ambulatory Visit: Payer: TRICARE For Life (TFL) | Admitting: Dermatology

## 2023-12-09 ENCOUNTER — Encounter (INDEPENDENT_AMBULATORY_CARE_PROVIDER_SITE_OTHER): Payer: Medicare Other | Admitting: Dermatology

## 2023-12-09 ENCOUNTER — Ambulatory Visit (INDEPENDENT_AMBULATORY_CARE_PROVIDER_SITE_OTHER): Payer: Self-pay | Admitting: Dermatology

## 2023-12-09 DIAGNOSIS — L988 Other specified disorders of the skin and subcutaneous tissue: Secondary | ICD-10-CM

## 2023-12-09 DIAGNOSIS — I781 Nevus, non-neoplastic: Secondary | ICD-10-CM

## 2023-12-09 NOTE — Progress Notes (Unsigned)
Follow-Up Visit   Subjective  Katelyn Dennis is a 68 y.o. female who presents for the following: Sclerotherapy, lip filler and botox for lips  The following portions of the chart were reviewed this encounter and updated as appropriate: medications, allergies, medical history  Review of Systems:  No other skin or systemic complaints except as noted in HPI or Assessment and Plan.  Objective  Well appearing patient in no apparent distress; mood and affect are within normal limits.  A focused examination was performed of the following areas: Legs, face, lips  Relevant exam findings are noted in the Assessment and Plan.                        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  Procedure: The patient was counseled and understands about the effects, side effects and potential risks and complications of the sclerotherapy procedure. The patient was given the opportunity to ask questions. Asclera (polidocanol) 1% (total 2cc) was injected into the varices. In order to ensure correct placement of the catheter in the vein, I drew back slightly to give moderate blood show in the syringe. If there was any evidence or suspicion of extravasation of sclerosant, the area was immediately diluted with a large volume of 0.9% saline. A pressure dressing was applied immediately to the injected sites. The patient tolerated the procedure well without complication. The patient was instructed in post-operative compression stocking use. The patient understands to call or return immediately if any problems noted.  Prior to the procedure, the patient's past medical history, allergies and the rare but potential risks and complications were reviewed with the patient and a signed  consent was obtained. Pre and post-treatment care was discussed and instructions provided.  May consider Laser / BBL for any remaining fine spider veins in future.   Location: lips  Filler Type: Juvederm Volbella XC 0.55 c  Lot [id number] Exp 2023-11-16  Procedure: The area was prepped thoroughly with Puracyn. After introducing the needle into the desired treatment area, the syringe plunger was drawn back to ensure there was no flash of blood prior to injecting the filler in order to minimize risk of intravascular injection and vascular occlusion. After injection of the filler, the treated areas were cleansed and iced to reduce swelling. Post-treatment instructions were reviewed with the patient.       Patient tolerated the procedure well. The patient will call with any problems, questions or concerns prior to their next appointment.  Facial Elastosis  Location: lips  Lower lip 4 units and  upper lip 4 units =  8 units total   Informed consent: Discussed risks (infection, pain, bleeding, bruising, swelling, allergic reaction, paralysis of nearby muscles, eyelid droop, double vision, neck weakness, difficulty breathing, headache, undesirable cosmetic result, and need for additional treatment) and benefits of the procedure, as well as the alternatives.  Informed consent was obtained.  Preparation: The area was cleansed with alcohol.  Procedure Details:  Botox was injected into the dermis with a 30-gauge needle. Pressure applied to any bleeding. Ice packs offered for swelling.  Lot Number:  [id number] Expiration:  09/2025  Total Units Injected:  8  Plan: Tylenol may be used for headache.  Allow 2 weeks before  returning to clinic for additional dosing as needed. Patient will call for any problems.    Return if symptoms worsen or fail to improve.  IAsher Muir, CMA, am acting as scribe for Armida Sans, MD.   Documentation: I have reviewed the above documentation for accuracy  and completeness, and I agree with the above.  Armida Sans, MD      Return if symptoms worsen or fail to improve.  IAsher Muir, CMA, am acting as scribe for Armida Sans, MD.   Documentation: I have reviewed the above documentation for accuracy and completeness, and I agree with the above.  Armida Sans, MD

## 2023-12-09 NOTE — Patient Instructions (Addendum)
After Your Sclerotherapy Procedure  1. Please wear the graduated compression stockings for 24 hours immediately following the completion of the sclerotherapy procedure.  2. We recommend that you avoid vigorous activity as much as possible for the first twenty-four (24) hours. You can do your "normal" routine, but avoid an above normal amount of time on your feet. Elevating the legs when sitting and avoidance of vigorous leg movements or exercise in the first few days after treatment may improve your results.  3. You may remove the compression dressings (cotton balls) and tape the next morning.  4. Please continue wearing the compression stockings during waking hours for the two (2) weeks following sclerotherapy.  5. If you have any blisters, sores or ulcers or other problems following your procedure please call or return to the office immediately.     THE PROCEDURE FEE IS $350.00 PER FIFTEEN (15) MINUTE SESSION. WE REQUIRE THAT THIS PROCEDURE BE PAID FOR IN FULL ON OR BEFORE THE DATE THAT IT IS PERFORMED. WE WILL GIVE YOU A RECEIPT THAT YOU CAN FILE WITH YOUR INSURANCE COMPANY. WE GENERALLY DO NOT FILE THIS PROCEDURE WITH ANY INSURANCE COMPANY EXCEPT UNDER CERTAIN CIRCUMSTANCES WHERE PRIOR AUTHORIZATION HAS BEEN CONFIRMED. THIS PROCEDURE IS GENERALLY CONSIDERED TO BE A COSMETIC PROCEDURE BY INSURANCE COMPANIES.Due to recent changes in healthcare laws, you may see results of your pathology and/or laboratory studies on MyChart before the doctors have had a chance to review them. We understand that in some cases there may be results that are confusing or concerning to you. Please understand that not all results are received at the same time and often the doctors may need to interpret multiple results in order to provide you with the best plan of care or course of treatment. Therefore, we ask that you please give [place] 2 business days to thoroughly review all your results before contacting the office for  clarification. Should we see a critical lab result, you will be contacted sooner.   If You Need Anything After Your Visit  If you have any questions or concerns for your doctor, please call our main line at [phone] and press option 4 to reach your doctor's medical assistant. If no one answers, please leave a voicemail as directed and we will return your call as soon as possible. Messages left after 4 pm will be answered the following business day.   You may also send [place] a message via MyChart. We typically respond to MyChart messages within 1-2 business days.  For prescription refills, please ask your pharmacy to contact our office. Our fax number is [phone].  If you have an urgent issue when the clinic is closed that cannot wait until the next business day, you can page your doctor at the number below.    Please note that while we do our best to be available for urgent issues outside of office hours, we are not available 24/7.   If you have an urgent issue and are unable to reach [place], you may choose to seek medical care at your doctor's office, retail clinic, urgent care center, or emergency room.  If you have a medical emergency, please immediately call 911 or go to the emergency department.  Pager Numbers  - Dr. Gwen Pounds: [phone]  - Dr. Roseanne Reno: [phone]  - Dr. Katrinka Blazing: [phone]   In the event of inclement weather, please call our main line at [phone] for an update on the status of any delays or closures.  Dermatology Medication Tips:  Please keep the boxes that topical medications come in in order to help keep track of the instructions about where and how to use these. Pharmacies typically print the medication instructions only on the boxes and not directly on the medication tubes.   If your medication is too expensive, please contact our office at [phone] option 4 or send [place] a message through MyChart.   We are unable to tell what your co-pay for  medications will be in advance as this is different depending on your insurance coverage. However, we may be able to find a substitute medication at lower cost or fill out paperwork to get insurance to cover a needed medication.   If a prior authorization is required to get your medication covered by your insurance company, please allow [place] 1-2 business days to complete this process.  Drug prices often vary depending on where the prescription is filled and some pharmacies may offer cheaper prices.  The website www.goodrx.com contains coupons for medications through different pharmacies. The prices here do not account for what the cost may be with help from insurance (it may be cheaper with your insurance), but the website can give you the price if you did not use any insurance.  - You can print the associated coupon and take it with your prescription to the pharmacy.  - You may also stop by our office during regular business hours and pick up a GoodRx coupon card.  - If you need your prescription sent electronically to a different pharmacy, notify our office through [facility] or by phone at [phone] option 4.     Si Usted Necesita Algo Despus de Su Visita  Tambin puede enviarnos un mensaje a travs de Clinical cytogeneticist. Por lo general respondemos a los mensajes de MyChart en el transcurso de 1 a 2 das hbiles.  Para renovar recetas, por favor pida a su farmacia que se ponga en contacto con nuestra oficina. Annie Sable de fax es [place] [phone].  Si tiene un asunto urgente cuando la clnica est cerrada y que no puede esperar hasta el siguiente da hbil, puede llamar/localizar a su doctor(a) al nmero que aparece a continuacin.   Por favor, tenga en cuenta que aunque hacemos todo lo posible para estar disponibles para asuntos urgentes fuera del horario de [place], no estamos disponibles las 24 horas del da, los 7 [address]  [address] de la [place].   Si tiene un problema urgente y no puede  comunicarse con nosotros, puede optar por buscar atencin mdica  en el consultorio de su doctor(a), en una clnica privada, en un centro de atencin urgente o en una sala de emergencias.  Si tiene Engineer, drilling, por favor llame inmediatamente al 911 o vaya a la sala de emergencias.  Nmeros de bper  - Dr. Gwen Pounds: [phone]  - Dra. Roseanne Reno: [phone]  - Dr. Katrinka Blazing: [phone]   En caso de inclemencias del tiempo, por favor llame a Lacy Duverney principal al [phone] para una actualizacin sobre el [place] de cualquier retraso o cierre.  Consejos para la medicacin en dermatologa: Por favor, guarde las cajas en las que vienen los medicamentos de uso tpico para ayudarle a seguir las instrucciones sobre dnde y cmo usarlos. Las farmacias generalmente imprimen las instrucciones del medicamento slo en las cajas y no directamente en los tubos del [place].   Si su medicamento es muy caro, por favor, pngase en contacto con Rolm Gala llamando al [phone] y presione la opcin 4 o envenos un mensaje a  travs de MyChart.   No podemos decirle cul ser su copago por los medicamentos por adelantado ya que esto es diferente dependiendo de la cobertura de su seguro. Sin embargo, es posible que podamos encontrar un medicamento sustituto a Audiological scientist un formulario para que el seguro cubra el medicamento que se considera necesario.   Si se requiere una autorizacin previa para que su compaa de seguros [place] su medicamento, por favor permtanos de 1 a 2 das hbiles para completar [address]  Los precios de los medicamentos varan con frecuencia dependiendo del Environmental consultant de dnde se surte la receta y alguna farmacias pueden ofrecer precios ms baratos.  El sitio web www.goodrx.com tiene cupones para medicamentos de Health and safety inspector. Los precios aqu no tienen en cuenta lo que podra costar con la ayuda del seguro (puede ser ms barato con su seguro), pero  el sitio web puede darle el precio si no utiliz Tourist information centre manager.  - Puede imprimir el cupn correspondiente y llevarlo con su receta a la farmacia.  - Tambin puede pasar por nuestra oficina durante el horario de atencin regular y Education officer, museum una tarjeta de cupones de GoodRx.  - Si necesita que su receta se enve electrnicamente a una farmacia diferente, informe a nuestra oficina a travs de MyChart de [place] o por telfono llamando al [phone] y presione la opcin 4.

## 2023-12-10 ENCOUNTER — Encounter: Payer: Self-pay | Admitting: Dermatology

## 2023-12-11 ENCOUNTER — Telehealth: Payer: Self-pay

## 2023-12-11 NOTE — Telephone Encounter (Signed)
Dr. Gwen Pounds requested patient be called to follow up on filler to lips and sclera therapy to legs.   Called and spoke to patient regarding procedures to see how she was doing. She reports she was a little achy in legs and sore from lip fillers but otherwise doing well. Patient voiced appreciation for phone call. She denied any concerns or further questions. Patient advised to call if she had any further questions or concerns.

## 2023-12-25 ENCOUNTER — Telehealth: Payer: Self-pay

## 2023-12-25 NOTE — Telephone Encounter (Signed)
 Spoke with patient today about Dr. Hester no longer accepting filler patients. Patient understands and will keep June right now for botox only.  Patient questioned BBL for smaller veins, noted at last appt. Advised patient IF she needed laser for smaller veins we could potentially change the botox appt to laser due to length of time and push botox to another date.   Emailed recommendations of Dr. Adigun and Dr. Beverley Czar Cox to patient. aw

## 2024-01-01 NOTE — Progress Notes (Signed)
This encounter was created in error - please disregard.

## 2024-05-12 ENCOUNTER — Telehealth: Payer: Self-pay

## 2024-05-12 NOTE — Telephone Encounter (Signed)
 Left message on voicemail for patient to return my call. She wanted to confirm that tomorrow's appointment is for sclerotherapy, but we have her scheduled for Botox, and Dr. Hester no longer does sclerotherapy. If she is interested in sclerotherapy we would need to schedule an appointment with Dr. Jackquline.

## 2024-05-13 ENCOUNTER — Encounter: Payer: Self-pay | Admitting: Dermatology

## 2024-05-13 ENCOUNTER — Ambulatory Visit (INDEPENDENT_AMBULATORY_CARE_PROVIDER_SITE_OTHER): Payer: TRICARE For Life (TFL) | Admitting: Dermatology

## 2024-05-13 ENCOUNTER — Ambulatory Visit (INDEPENDENT_AMBULATORY_CARE_PROVIDER_SITE_OTHER): Payer: Self-pay | Admitting: Dermatology

## 2024-05-13 DIAGNOSIS — L988 Other specified disorders of the skin and subcutaneous tissue: Secondary | ICD-10-CM

## 2024-05-13 DIAGNOSIS — I8393 Asymptomatic varicose veins of bilateral lower extremities: Secondary | ICD-10-CM

## 2024-05-13 DIAGNOSIS — I781 Nevus, non-neoplastic: Secondary | ICD-10-CM

## 2024-05-13 NOTE — Progress Notes (Signed)
   Follow-Up Visit   Subjective  Katelyn Dennis is a 68 y.o. female who presents for the following: Sclerotherapy    The following portions of the chart were reviewed this encounter and updated as appropriate: medications, allergies, medical history  Review of Systems:  No other skin or systemic complaints except as noted in HPI or Assessment and Plan.  Objective  Well appearing patient in no apparent distress; mood and affect are within normal limits.  A focused examination was performed of the following areas: Legs   Relevant exam findings are noted in the Assessment and Plan.    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 Photos taken  Procedure: The patient was counseled and understands about the effects, side effects and potential risks and complications of the sclerotherapy procedure. The patient was given the opportunity to ask questions. Asclera (polidocanol) 1% (total 2cc) was injected into the varices. In order to ensure correct placement of the catheter in the vein, I drew back slightly to give moderate blood show in the syringe. If there was any evidence or suspicion of extravasation of sclerosant, the area was immediately diluted with a large volume of 0.9% saline. A pressure dressing was applied immediately to the injected sites. The patient tolerated the procedure well without complication. The patient was instructed in post-operative compression stocking use. The patient understands to call or return immediately if any problems noted.  Varicose Veins - Dilated blue, purple or red veins at the lower extremities - Reassured - Smaller vessels can be treated by sclerotherapy (a procedure to inject a medicine into the veins to make them disappear) if desired, but  the treatment is not covered by insurance. Larger vessels may be covered if symptomatic and we would refer to vascular surgeon if treatment desired.  VARICOSE VEINS OF BOTH LOWER EXTREMITIES, UNSPECIFIED WHETHER COMPLICATED   Related Procedures Ambulatory referral to Vascular Surgery  Return if symptoms worsen or fail to improve.  I, Jill Parcell, CMA, am acting as scribe for Rexene Rattler, MD.   Documentation: I have reviewed the above documentation for accuracy and completeness, and I agree with the above.  Rexene Rattler, MD

## 2024-05-13 NOTE — Patient Instructions (Signed)

## 2024-05-13 NOTE — Progress Notes (Signed)
   Follow-Up Visit   Subjective  Katelyn Dennis is a 68 y.o. female who presents for the following: Botox for facial elastosis  The following portions of the chart were reviewed this encounter and updated as appropriate: medications, allergies, medical history  Review of Systems:  No other skin or systemic complaints except as noted in HPI or Assessment and Plan.  Objective  Well appearing patient in no apparent distress; mood and affect are within normal limits.  A focused examination was performed of the face.  Relevant physical exam findings are noted in the Assessment and Plan.  Injection map photo     Assessment & Plan    Facial Elastosis Botox 28 units injected today to: - Frown complex 20 units - Upper lip x 4 units - Lower lip x 4 units  Location: See attached image  Informed consent: Discussed risks (infection, pain, bleeding, bruising, swelling, allergic reaction, paralysis of nearby muscles, eyelid droop, double vision, neck weakness, difficulty breathing, headache, undesirable cosmetic result, and need for additional treatment) and benefits of the procedure, as well as the alternatives.  Informed consent was obtained.  Preparation: The area was cleansed with alcohol.  Procedure Details:  Botox was injected into the dermis with a 30-gauge needle. Pressure applied to any bleeding. Ice packs offered for swelling.  Lot Number:  [id number] Expiration:  02/2026  Total Units Injected:  28  Plan: Tylenol  may be used for headache.  Allow 2 weeks before returning to clinic for additional dosing as needed. Patient will call for any problems.  Return for 3-63m Botox.  I, Grayce Saunas, RMA, am acting as scribe for Alm Rhyme, MD .   Documentation: I have reviewed the above documentation for accuracy and completeness, and I agree with the above.  Alm Rhyme, MD

## 2024-05-13 NOTE — Patient Instructions (Signed)
 BEFORE YOUR APPOINTMENT FOR SCLEROTHERAPY  1. When you telephone for your appointment for the sclerotherapy procedure, please let the receptionist know that you are scheduling for the fifteen (15) minute sclerotherapy procedure not just a regular visit.  2. On the day of the procedure, please cleanse and dry the areas, but do not use any moisturizers or other products on the area(s) to be treated.  3. Bring a pair of comfortable shorts to wear during the procedure.  4. Be sure to bring your recommended graduated compression stockings with you to the office. You will be wearing them home when your visit is over. These compression hose can be purchased at most medical supply stores.  After Your Sclerotherapy Procedure  1. Please wear the graduated compression stockings for 24 hours immediately following the completion of the sclerotherapy procedure.  2. We recommend that you avoid vigorous activity as much as possible for the first twenty-four (24) hours. You can do your "normal" routine, but avoid an above normal amount of time on your feet. Elevating the legs when sitting and avoidance of vigorous leg movements or exercise in the first few days after treatment may improve your results.  3. You may remove the compression dressings (cotton balls) and tape the next morning.  4. Please continue wearing the compression stockings during waking hours for the two (2) weeks following sclerotherapy.  5. If you have any blisters, sores or ulcers or other problems following your procedure please call or return to the office immediately.     THE PROCEDURE FEE IS $350.00 PER FIFTEEN (15) MINUTE SESSION. WE REQUIRE THAT THIS PROCEDURE BE PAID FOR IN FULL ON OR BEFORE THE DATE THAT IT IS PERFORMED. WE WILL GIVE YOU A RECEIPT THAT YOU CAN FILE WITH YOUR INSURANCE COMPANY. WE GENERALLY DO NOT FILE THIS PROCEDURE WITH ANY INSURANCE COMPANY EXCEPT UNDER CERTAIN CIRCUMSTANCES WHERE PRIOR AUTHORIZATION HAS BEEN  CONFIRMED. THIS PROCEDURE IS GENERALLY CONSIDERED TO BE A COSMETIC PROCEDURE BY INSURANCE COMPANIES.  Due to recent changes in healthcare laws, you may see results of your pathology and/or laboratory studies on MyChart before the doctors have had a chance to review them. We understand that in some cases there may be results that are confusing or concerning to you. Please understand that not all results are received at the same time and often the doctors may need to interpret multiple results in order to provide you with the best plan of care or course of treatment. Therefore, we ask that you please give [place] 2 business days to thoroughly review all your results before contacting the office for clarification. Should we see a critical lab result, you will be contacted sooner.   If You Need Anything After Your Visit  If you have any questions or concerns for your doctor, please call our main line at [phone] and press option 4 to reach your doctor's medical assistant. If no one answers, please leave a voicemail as directed and we will return your call as soon as possible. Messages left after 4 pm will be answered the following business day.   You may also send [place] a message via MyChart. We typically respond to MyChart messages within 1-2 business days.  For prescription refills, please ask your pharmacy to contact our office. Our fax number is [phone].  If you have an urgent issue when the clinic is closed that cannot wait until the next business day, you can page your doctor at the number below.    Please  note that while we do our best to be available for urgent issues outside of office hours, we are not available 24/7.   If you have an urgent issue and are unable to reach [place], you may choose to seek medical care at your doctor's office, retail clinic, urgent care center, or emergency room.  If you have a medical emergency, please immediately call 911 or go to the emergency department.  Pager  Numbers  - Dr. Gwen Pounds: [phone]  - Dr. Roseanne Reno: [phone]  - Dr. Katrinka Blazing: [phone]   In the event of inclement weather, please call our main line at [phone] for an update on the status of any delays or closures.  Dermatology Medication Tips: Please keep the boxes that topical medications come in in order to help keep track of the instructions about where and how to use these. Pharmacies typically print the medication instructions only on the boxes and not directly on the medication tubes.   If your medication is too expensive, please contact our office at [phone] option 4 or send [place] a message through MyChart.   We are unable to tell what your co-pay for medications will be in advance as this is different depending on your insurance coverage. However, we may be able to find a substitute medication at lower cost or fill out paperwork to get insurance to cover a needed medication.   If a prior authorization is required to get your medication covered by your insurance company, please allow [place] 1-2 business days to complete this process.  Drug prices often vary depending on where the prescription is filled and some pharmacies may offer cheaper prices.  The website www.goodrx.com contains coupons for medications through different pharmacies. The prices here do not account for what the cost may be with help from insurance (it may be cheaper with your insurance), but the website can give you the price if you did not use any insurance.  - You can print the associated coupon and take it with your prescription to the pharmacy.  - You may also stop by our office during regular business hours and pick up a GoodRx coupon card.  - If you need your prescription sent electronically to a different pharmacy, notify our office through [facility] or by phone at [phone] option 4.     Si Usted Necesita Algo Despus de Su Visita  Tambin puede enviarnos un mensaje a travs de  Clinical cytogeneticist. Por lo general respondemos a los mensajes de MyChart en el transcurso de 1 a 2 das hbiles.  Para renovar recetas, por favor pida a su farmacia que se ponga en contacto con nuestra oficina. Annie Sable de fax es [place] [phone].  Si tiene un asunto urgente cuando la clnica est cerrada y que no puede esperar hasta el siguiente da hbil, puede llamar/localizar a su doctor(a) al nmero que aparece a continuacin.   Por favor, tenga en cuenta que aunque hacemos todo lo posible para estar disponibles para asuntos urgentes fuera del horario de [place], no estamos disponibles las 24 horas del da, los 7 [address]  [address] de la [place].   Si tiene un problema urgente y no puede comunicarse con nosotros, puede optar por buscar atencin mdica  en el consultorio de su doctor(a), en una clnica privada, en un centro de atencin urgente o en una sala de emergencias.  Si tiene Engineer, drilling, por favor llame inmediatamente al 911 o vaya a la sala de emergencias.  Nmeros de bper  - Dr. Gwen Pounds:  [phone]  - Dra. Roseanne Reno: [phone]  - Dr. Katrinka Blazing: [phone]   En caso de inclemencias del tiempo, por favor llame a Lacy Duverney principal al [phone] para una actualizacin sobre el [place] de cualquier retraso o cierre.  Consejos para la medicacin en dermatologa: Por favor, guarde las cajas en las que vienen los medicamentos de uso tpico para ayudarle a seguir las instrucciones sobre dnde y cmo usarlos. Las farmacias generalmente imprimen las instrucciones del medicamento slo en las cajas y no directamente en los tubos del [place].   Si su medicamento es muy caro, por favor, pngase en contacto con Rolm Gala llamando al [phone] y presione la opcin 4 o envenos un mensaje a travs de Clinical cytogeneticist.   No podemos decirle cul ser su copago por los medicamentos por adelantado ya que esto es diferente dependiendo de la cobertura de su seguro. Sin embargo, es posible que  podamos encontrar un medicamento sustituto a Audiological scientist un formulario para que el seguro cubra el medicamento que se considera necesario.   Si se requiere una autorizacin previa para que su compaa de seguros [place] su medicamento, por favor permtanos de 1 a 2 das hbiles para completar [address]  Los precios de los medicamentos varan con frecuencia dependiendo del Environmental consultant de dnde se surte la receta y alguna farmacias pueden ofrecer precios ms baratos.  El sitio web www.goodrx.com tiene cupones para medicamentos de Health and safety inspector. Los precios aqu no tienen en cuenta lo que podra costar con la ayuda del seguro (puede ser ms barato con su seguro), pero el sitio web puede darle el precio si no utiliz Tourist information centre manager.  - Puede imprimir el cupn correspondiente y llevarlo con su receta a la farmacia.  - Tambin puede pasar por nuestra oficina durante el horario de atencin regular y Education officer, museum una tarjeta de cupones de GoodRx.  - Si necesita que su receta se enve electrnicamente a una farmacia diferente, informe a nuestra oficina a travs de MyChart de [place] o por telfono llamando al [phone] y presione la opcin 4.

## 2024-05-23 ENCOUNTER — Ambulatory Visit
Admission: EM | Admit: 2024-05-23 | Discharge: 2024-05-23 | Disposition: A | Discharge status: Home / Self Care | Source: Ambulatory Visit

## 2024-05-23 ENCOUNTER — Encounter: Payer: Self-pay | Admitting: Emergency Medicine

## 2024-05-23 DIAGNOSIS — N3090 Cystitis, unspecified without hematuria: Secondary | ICD-10-CM | POA: Insufficient documentation

## 2024-05-23 LAB — POCT URINALYSIS DIP (MANUAL ENTRY)
Bilirubin, UA: NEGATIVE
Glucose, UA: NEGATIVE mg/dL
Ketones, POC UA: NEGATIVE mg/dL
Nitrite, UA: NEGATIVE
Protein Ur, POC: 100 mg/dL — AB
Spec Grav, UA: 1.025 (ref 1.010–1.025)
Urobilinogen, UA: 0.2 U/dL
pH, UA: 5.5 (ref 5.0–8.0)

## 2024-05-23 MED ORDER — SULFAMETHOXAZOLE-TRIMETHOPRIM 800-160 MG PO TABS: 1.0000 | ORAL_TABLET | Freq: Two times a day (BID) | ORAL | 0 refills | Status: AC

## 2024-05-23 NOTE — Discharge Instructions (Addendum)
  1. Recurrent cystitis (Primary) - POCT urinalysis dipstick completed in UC shows large leukocytes, large blood, no nitrite, these findings are possibly indicative of recurrent UTI. - Urine Culture collected in UC and sent to lab for further testing results should be available in 2 to 3 days. - sulfamethoxazole -trimethoprim  (BACTRIM  DS) 800-160 MG tablet; Take 1 tablet by mouth 2 (two) times daily for 7 days.  Dispense: 14 tablet; Refill: 0 -Continue to monitor symptoms for any change in severity if there is any escalation of current symptoms or development of new symptoms follow-up in ER or UC for further evaluation and management.

## 2024-05-23 NOTE — ED Triage Notes (Signed)
 Patient complains of urinary frequency x 1 day. Just finished Keflex on June 25th.

## 2024-05-23 NOTE — ED Provider Notes (Signed)
 [facility]  Note:  This document was prepared using Conservation officer, historic buildings and may include unintentional dictation errors.  MRN: [id number] DOB: 09-28-1956  Subjective:   Katelyn Dennis is a 68 y.o. female presenting for Increased urinary frequency x 1 day.  Patient reports that she just finished taking Keflex on June 25 for urinary tract infection.  Patient denies any dysuria, abdominal pain, pelvic pain, flank pain.  Patient denies any other secondary symptoms at this time.  Patient reached out to her primary care provider about following up with urology because of frequent UTIs that have happened over the last few years.  No current facility-administered medications for this encounter.  Current Outpatient Medications:    sulfamethoxazole -trimethoprim  (BACTRIM  DS) 800-160 MG tablet, Take 1 tablet by mouth 2 (two) times daily for 7 days., Disp: 14 tablet, Rfl: 0   amLODipine (NORVASC) 2.5 MG tablet, Take 1 tablet by mouth daily., Disp: , Rfl:    clobetasol  cream (TEMOVATE ) 0.05 %, Apply to itchy bites on skin twice a day for 2 weeks, Avoid applying to face, groin, and axilla. Use as directed. Long-term use can cause thinning of the skin., Disp: 15 g, Rfl: 1   estradiol (ESTRACE) 2 MG tablet, Take 2 mg by mouth daily., Disp: , Rfl:    mupirocin  ointment (BACTROBAN ) 2 %, Apply to scabs or infected areas on skin 2-3 times a day, Disp: 22 g, Rfl: 2   pantoprazole (PROTONIX) 40 MG tablet, Take 1 tablet by mouth daily., Disp: , Rfl:    pilocarpine (SALAGEN) 5 MG tablet, Take 5 mg by mouth 3 (three) times daily., Disp: , Rfl:    progesterone (PROMETRIUM) 100 MG capsule, Take 100 mg by mouth at bedtime., Disp: , Rfl:    tolterodine (DETROL LA) 4 MG 24 hr capsule, Take 4 mg by mouth daily., Disp: , Rfl:    traMADol  (ULTRAM ) 50 MG tablet, Take 1 tablet (50 mg total) by mouth every 6 (six) hours as needed., Disp: 10 tablet, Rfl: 0   valACYclovir  (VALTREX ) 500 MG tablet, Take one  tab po BID x 7 days. Start one day prior to procedure., Disp: 14 tablet, Rfl: 0   Allergies  Allergen Reactions   Minocycline Nausea And Vomiting   Iodine Rash    Past Medical History:  Diagnosis Date   Abnormal barium swallow 06/20/2021   Arthritis    CREST (calcinosis, Raynaud's phenomenon, esophageal dysfunction, sclerodactyly, telangiectasia) (HCC) 02/09/2019   Esophageal dysphagia 06/20/2021   Gastroesophageal reflux disease without esophagitis 02/09/2019   Hiatal hernia 06/20/2021   History of colonic polyps 06/20/2021   Hx of dysplastic nevus 06/23/2020   R epigastric moderate to severe, shave removal 08/04/20   Schatzki's ring 06/20/2021     Past Surgical History:  Procedure Laterality Date   BREAST BIOPSY Left 11/01/2022   US  Bx, Ribbon Clip, path pending   BREAST BIOPSY Left 11/01/2022   US  Bx, Heart Clip, path pending   BREAST BIOPSY Left 11/01/2022   US  LT BREAST BX W LOC DEV EA ADD LESION IMG BX SPEC US  GUIDE 11/01/2022 [facility]   BREAST BIOPSY Left 11/01/2022   US  LT BREAST BX W LOC DEV 1ST LESION IMG BX SPEC US  GUIDE 11/01/2022 [facility]   BREAST BIOPSY Left 01/15/2023   US  Bx, Coil Clip, path pending   BREAST BIOPSY Right 01/15/2023   US  RT BREAST BX W LOC DEV 1ST LESION IMG BX SPEC US  GUIDE 01/15/2023 [facility]   BREAST BIOPSY  04/02/2023  MM RT RADIOACTIVE SEED LOC MAMMO GUIDE 04/02/2023 [facility]   BREAST CYST EXCISION Right late 90s   axilla area- fibroadenoma   BREAST CYST EXCISION Left 2015   same as right   BREAST EXCISIONAL BIOPSY Bilateral    fibroadenomas per pt- years ago   KNEE ARTHROSCOPY Right    RADIOACTIVE SEED GUIDED EXCISIONAL BREAST BIOPSY Right 04/03/2023   Procedure: RADIOACTIVE SEED GUIDED EXCISIONAL RIGHT BREAST BIOPSY;  Surgeon: Ebbie Cough, MD;  Location: [facility];  Service: General;  Laterality: Right;   TONSILLECTOMY      Family History  Problem Relation Age of Onset    Bladder Cancer Neg Hx    Kidney cancer Neg Hx    Prostate cancer Neg Hx    Breast cancer Neg Hx     Social History   Tobacco Use   Smoking status: Never   Smokeless tobacco: Never  Substance Use Topics   Alcohol use: Never   Drug use: Never    ROS Refer to HPI for ROS details.  Objective:   Vitals: BP 132/77 (BP Location: Left Arm)   Pulse 82   Temp 98.2 F (36.8 C) (Oral)   Resp 18   SpO2 100%   Physical Exam Vitals and nursing note reviewed.  Constitutional:      General: She is not in acute distress.    Appearance: She is well-developed. She is not ill-appearing or toxic-appearing.  HENT:     Head: Normocephalic and atraumatic.  Cardiovascular:     Rate and Rhythm: Normal rate.  Pulmonary:     Effort: Pulmonary effort is normal. No respiratory distress.  Abdominal:     Tenderness: There is no abdominal tenderness. There is no right CVA tenderness or left CVA tenderness.  Skin:    General: Skin is warm and dry.  Neurological:     General: No focal deficit present.     Mental Status: She is alert and oriented to person, place, and time.  Psychiatric:        Mood and Affect: Mood normal.        Behavior: Behavior normal.     Procedures  Results for orders placed or performed during the hospital encounter of 05/23/24 (from the past 24 hours)  POCT urinalysis dipstick     Status: Abnormal   Collection Time: 05/23/24  2:19 PM  Result Value Ref Range   Color, UA yellow yellow   Clarity, UA cloudy (A) clear   Glucose, UA negative negative mg/dL   Bilirubin, UA negative negative   Ketones, POC UA negative negative mg/dL   Spec Grav, UA 8.974 8.989 - 1.025   Blood, UA large (A) negative   pH, UA 5.5 5.0 - 8.0   Protein Ur, POC =100 (A) negative mg/dL   Urobilinogen, UA 0.2 0.2 or 1.0 E.U./dL   Nitrite, UA Negative Negative   Leukocytes, UA Large (3+) (A) Negative    No results found.   Assessment and Plan :     Discharge Instructions        1. Recurrent cystitis (Primary) - POCT urinalysis dipstick completed in UC shows large leukocytes, large blood, no nitrite, these findings are possibly indicative of recurrent UTI. - Urine Culture collected in UC and sent to lab for further testing results should be available in 2 to 3 days. - sulfamethoxazole -trimethoprim  (BACTRIM  DS) 800-160 MG tablet; Take 1 tablet by mouth 2 (two) times daily for 7 days.  Dispense: 14 tablet; Refill: 0 -  Continue to monitor symptoms for any change in severity if there is any escalation of current symptoms or development of new symptoms follow-up in ER or UC for further evaluation and management.      Rie Mcneil B Achsah Mcquade   Lonnel Gjerde, Guy [place], [place] 05/23/24 [phone]

## 2024-05-24 LAB — URINE CULTURE
Culture: NO GROWTH
Special Requests: NORMAL

## 2024-05-25 ENCOUNTER — Ambulatory Visit (HOSPITAL_COMMUNITY): Payer: Self-pay

## 2024-06-01 ENCOUNTER — Encounter (INDEPENDENT_AMBULATORY_CARE_PROVIDER_SITE_OTHER): Payer: Self-pay | Admitting: Vascular Surgery

## 2024-06-01 ENCOUNTER — Ambulatory Visit (INDEPENDENT_AMBULATORY_CARE_PROVIDER_SITE_OTHER): Admitting: Vascular Surgery

## 2024-06-01 VITALS — BP 121/66 | HR 73 | Resp 18 | Ht 60.5 in | Wt 118.4 lb

## 2024-06-01 DIAGNOSIS — K219 Gastro-esophageal reflux disease without esophagitis: Secondary | ICD-10-CM | POA: Diagnosis not present

## 2024-06-01 DIAGNOSIS — I83893 Varicose veins of bilateral lower extremities with other complications: Secondary | ICD-10-CM | POA: Diagnosis not present

## 2024-06-01 DIAGNOSIS — M341 CR(E)ST syndrome: Secondary | ICD-10-CM

## 2024-06-09 ENCOUNTER — Encounter (INDEPENDENT_AMBULATORY_CARE_PROVIDER_SITE_OTHER): Payer: Self-pay | Admitting: Vascular Surgery

## 2024-06-09 NOTE — Progress Notes (Addendum)
 Subjective:    Patient ID: Katelyn Dennis, female    DOB: 25-Jan-1956, 68 y.o.   MRN: [id number] Chief Complaint  Patient presents with   Establish Care    New patient consult Varicose Veins bilateral lower extremities Ref. 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  Patient endorses her varicose veins started about a year ago and it progressively got worse.  She now has 2 areas to her right thigh area that are painful upon ambulation, at rest and to touch.  She does have multiple other areas throughout both of her legs that are not painful but visible.  She has not tried any conventional therapy at this time.  However she does exercise 3 times a week putting a lot of pressure on her lower extremities which she is not resting and elevating her legs or using any compression to help relieve any vascular insufficiency or lymphedema.  She endorses she has not had any scleroderma or any laser treatments to her venous vascular system prior.    Review of Systems  Constitutional: Negative.   Cardiovascular:  Positive for leg swelling.  Skin:  Positive for color change.       Multiple areas of varicose veins with 2 areas to her right thigh being painful  All other systems reviewed and are negative.      Objective:   Physical Exam Vitals reviewed.  Constitutional:      Appearance: Normal appearance.  HENT:     Head: Normocephalic.  Eyes:     Pupils: Pupils are equal, round, and reactive to light.  Cardiovascular:     Rate and Rhythm: Normal rate and regular rhythm.     Pulses: Normal pulses.     Heart sounds: Normal heart sounds.  Pulmonary:     Effort: Pulmonary effort is normal.     Breath sounds: Normal breath sounds.  Abdominal:     General: Abdomen is flat. Bowel sounds are normal.     Palpations: Abdomen is soft.  Musculoskeletal:        General: Swelling and tenderness present.      Right lower leg: Edema present.     Left lower leg: Edema present.  Skin:    General: Skin is warm and dry.     Capillary Refill: Capillary refill takes 2 to 3 seconds.  Neurological:     General: No focal deficit present.     Mental Status: She is alert and oriented to person, place, and time. Mental status is at baseline.  Psychiatric:        Mood and Affect: Mood normal.        Behavior: Behavior normal.        Thought Content: Thought content normal.        Judgment: Judgment normal.     BP 121/66 (BP Location: Right Arm, Patient Position: Sitting, Cuff Size: Small)   Pulse 73   Resp 18   Ht 5' 0.5 (1.537 m)   Wt 118 lb 6.4 oz (53.7 kg)   BMI 22.74 kg/m   Past Medical History:  Diagnosis Date   Abnormal barium swallow 06/20/2021   Arthritis    CREST (calcinosis, Raynaud's phenomenon, esophageal dysfunction, sclerodactyly, telangiectasia) (HCC) 02/09/2019   Esophageal dysphagia 06/20/2021   Gastroesophageal reflux disease without esophagitis 02/09/2019   Hiatal hernia 06/20/2021   History of colonic polyps 06/20/2021   Hx  of dysplastic nevus 06/23/2020   R epigastric moderate to severe, shave removal 08/04/20   Schatzki's ring 06/20/2021    Social History   Socioeconomic History   Marital status: Married    Spouse name: Not on file   Number of children: Not on file   Years of education: Not on file   Highest education level: Not on file  Occupational History   Not on file  Tobacco Use   Smoking status: Never   Smokeless tobacco: Never  Substance and Sexual Activity   Alcohol use: Never   Drug use: Not Currently   Sexual activity: Not on file  Other Topics Concern   Not on file  Social History Narrative   Not on file   Social Drivers of Health   Financial Resource Strain: Patient Declined (02/19/2024)   Received from [facility]   Overall Financial Resource Strain (CARDIA)    Difficulty of Paying Living Expenses: Patient declined   Food Insecurity: Patient Declined (02/19/2024)   [facility]   Hunger Vital Sign    Within the past 12 months, you worried that your food would run out before you got the money to buy more.: Patient declined    Within the past 12 months, the food you bought just didn't last and you didn't have money to get more.: Patient declined  Transportation Needs: Patient Declined (02/19/2024)   Received from [facility]   PRAPARE - Transportation    In the past 12 months, has lack of transportation kept you from medical appointments or from getting medications?: Patient declined    Lack of Transportation (Non-Medical): Patient declined  Physical Activity: Not on file  Stress: Not on file  Social Connections: Not on file  Intimate Partner Violence: Not on file    Past Surgical History:  Procedure Laterality Date   BREAST BIOPSY Left 11/01/2022   US  Bx, Ribbon Clip, path pending   BREAST BIOPSY Left 11/01/2022   US  Bx, Heart Clip, path pending   BREAST BIOPSY Left 11/01/2022   US  LT BREAST BX W LOC DEV EA ADD LESION IMG BX SPEC US  GUIDE 11/01/2022 [facility]   BREAST BIOPSY Left 11/01/2022   US  LT BREAST BX W LOC DEV 1ST LESION IMG BX SPEC US  GUIDE 11/01/2022 [facility]   BREAST BIOPSY Left 01/15/2023   US  Bx, Coil Clip, path pending   BREAST BIOPSY Right 01/15/2023   US  RT BREAST BX W LOC DEV 1ST LESION IMG BX SPEC US  GUIDE 01/15/2023 [facility]   BREAST BIOPSY  04/02/2023   MM RT RADIOACTIVE SEED LOC MAMMO GUIDE 04/02/2023 [facility]   BREAST CYST EXCISION Right late 90s   axilla area- fibroadenoma   BREAST CYST EXCISION Left 2015   same as right   BREAST EXCISIONAL BIOPSY Bilateral    fibroadenomas per pt- years ago   KNEE ARTHROSCOPY Right    RADIOACTIVE SEED GUIDED EXCISIONAL BREAST BIOPSY Right 04/03/2023   Procedure: RADIOACTIVE SEED GUIDED EXCISIONAL RIGHT BREAST BIOPSY;  Surgeon: Ebbie Cough, MD;   Location: [facility];  Service: General;  Laterality: Right;   TONSILLECTOMY      Family History  Problem Relation Age of Onset   Cancer Mother    Bladder Cancer Neg Hx    Kidney cancer Neg Hx    Prostate cancer Neg Hx    Breast cancer Neg Hx     Allergies  Allergen Reactions   Minocycline Nausea And Vomiting  Iodine Rash        No data to display            CMP  No results found for: NA, K, CL, CO2, GLUCOSE, BUN, CREATININE, CALCIUM, PROT, ALBUMIN, AST, ALT, ALKPHOS, BILITOT, GFR, EGFR, GFRNONAA   No results found.     Assessment & Plan:   1. CREST (calcinosis, Raynaud's phenomenon, esophageal dysfunction, sclerodactyly, telangiectasia) (HCC) (Primary) Continue antihypertensive medications as already ordered to help treat your CREST, these medications have been reviewed and there are no changes at this time.  2. Gastroesophageal reflux disease without esophagitis Continue PPI as already ordered, this medication has been reviewed and there are no changes at this time.  Avoidence of caffeine and alcohol  Moderate elevation of the head of the bed   3. Lymphedema Recommend:  I have had a long discussion with the patient regarding swelling and why it  causes symptoms.  Patient will begin wearing graduated compression on a daily basis a prescription was given. The patient will  wear the stockings first thing in the morning and removing them in the evening. The patient is instructed specifically not to sleep in the stockings.   In addition, behavioral modification will be initiated.  This will include frequent elevation, use of over the counter pain medications and exercise such as walking.  Consideration for a lymph pump will also be made based upon the effectiveness of conservative therapy.  This would help to improve the edema control and prevent sequela such as ulcers and infections   Patient should undergo duplex  ultrasound of the venous system to ensure that DVT or reflux is not present.  The patient will follow-up with me after the ultrasound.   We also discussed in detail sclerotherapy for the 2 areas to her right thigh with varicose veins that are painful.  At this time patient would like to try conventional therapy to see if she can relieve some of her varicosities and if this is unsuccessful she is willing to consider sclerotherapy in the future.  We will discuss this issue again on her follow-up visit.  Current Outpatient Medications on File Prior to Visit  Medication Sig Dispense Refill   amLODipine (NORVASC) 2.5 MG tablet Take 1 tablet by mouth daily.     clobetasol  cream (TEMOVATE ) 0.05 % Apply to itchy bites on skin twice a day for 2 weeks, Avoid applying to face, groin, and axilla. Use as directed. Long-term use can cause thinning of the skin. 15 g 1   estradiol  (ESTRACE ) 2 MG tablet Take 2 mg by mouth daily.     mupirocin  ointment (BACTROBAN ) 2 % Apply to scabs or infected areas on skin 2-3 times a day 22 g 2   pantoprazole (PROTONIX) 40 MG tablet Take 1 tablet by mouth daily.     pilocarpine (SALAGEN) 5 MG tablet Take 5 mg by mouth 3 (three) times daily.     progesterone (PROMETRIUM) 100 MG capsule Take 100 mg by mouth at bedtime.     tolterodine (DETROL LA) 4 MG 24 hr capsule Take 4 mg by mouth daily.     traMADol  (ULTRAM ) 50 MG tablet Take 1 tablet (50 mg total) by mouth every 6 (six) hours as needed. 10 tablet 0   valACYclovir  (VALTREX ) 500 MG tablet Take one tab po BID x 7 days. Start one day prior to procedure. 14 tablet 0   No current facility-administered medications on file prior to visit.    There are  no Patient Instructions on file for this visit. No follow-ups on file.   Gwendlyn JONELLE Shank, NP

## 2024-06-16 ENCOUNTER — Ambulatory Visit (INDEPENDENT_AMBULATORY_CARE_PROVIDER_SITE_OTHER): Admitting: Physician Assistant

## 2024-06-16 VITALS — BP 123/76 | Ht 60.0 in | Wt 118.0 lb

## 2024-06-16 DIAGNOSIS — N390 Urinary tract infection, site not specified: Secondary | ICD-10-CM

## 2024-06-16 DIAGNOSIS — N958 Other specified menopausal and perimenopausal disorders: Secondary | ICD-10-CM

## 2024-06-16 DIAGNOSIS — N3281 Overactive bladder: Secondary | ICD-10-CM | POA: Diagnosis not present

## 2024-06-16 LAB — BLADDER SCAN AMB NON-IMAGING

## 2024-06-16 MED ORDER — MIRABEGRON ER 50 MG PO TB24: 50.0000 mg | ORAL_TABLET | Freq: Every day | ORAL | 11 refills | Status: DC

## 2024-06-16 MED ORDER — ESTRADIOL 0.1 MG/GM VA CREA: TOPICAL_CREAM | VAGINAL | 12 refills | Status: AC

## 2024-06-16 NOTE — Progress Notes (Signed)
 06/16/2024 4:14 PM   Katelyn Dennis June 09, 1956 [id number]  CC: Chief Complaint  Patient presents with   Recurrent UTI   HPI: Katelyn Dennis is a 68 y.o. female with PMH CREST, OAB on tolterodine, and occasional UTI who presents today for evaluation of frequent UTIs..   Today she reports episodes of frequency, urgency, intermittency, tingling with voiding, and chills up her spine with voiding that occur about every 3 months and resolved with antibiotics.  She is frustrated with the frequency of these episodes.   She has been on tolterodine long-term for her OAB but has had persistent urinary frequency every 2-3 hours despite the medication.  She has CREST with dry eye and dry mouth.  She describes occasional weak stream or urinary intermittency and wonders if this could be related.  She has been on oral HRT for about 20 years.  She still has her uterus.  She has vaginal dryness, and uses nonhormonal vaginal suppositories for this.  She denies vaginal bulging or pressure.  PVR >35 mL.  Urine culture history as follows: 05/23/2024: No growth 05/06/2024: Ampicillin resistant E. Coli (low colony count) 02/07/2024: Insignificant growth 07/24/2023: Ampicillin resistant E. Coli (low colony count)  PMH: Past Medical History:  Diagnosis Date   Abnormal barium swallow 06/20/2021   Arthritis    CREST (calcinosis, Raynaud's phenomenon, esophageal dysfunction, sclerodactyly, telangiectasia) (HCC) 02/09/2019   Esophageal dysphagia 06/20/2021   Gastroesophageal reflux disease without esophagitis 02/09/2019   Hiatal hernia 06/20/2021   History of colonic polyps 06/20/2021   Hx of dysplastic nevus 06/23/2020   R epigastric moderate to severe, shave removal 08/04/20   Schatzki's ring 06/20/2021    Surgical History: Past Surgical History:  Procedure Laterality Date   BREAST BIOPSY Left 11/01/2022   US  Bx, Ribbon Clip, path pending   BREAST BIOPSY Left 11/01/2022   US  Bx, Heart Clip, path pending    BREAST BIOPSY Left 11/01/2022   US  LT BREAST BX W LOC DEV EA ADD LESION IMG BX SPEC US  GUIDE 11/01/2022 [facility]   BREAST BIOPSY Left 11/01/2022   US  LT BREAST BX W LOC DEV 1ST LESION IMG BX SPEC US  GUIDE 11/01/2022 [facility]   BREAST BIOPSY Left 01/15/2023   US  Bx, Coil Clip, path pending   BREAST BIOPSY Right 01/15/2023   US  RT BREAST BX W LOC DEV 1ST LESION IMG BX SPEC US  GUIDE 01/15/2023 [facility]   BREAST BIOPSY  04/02/2023   MM RT RADIOACTIVE SEED LOC MAMMO GUIDE 04/02/2023 [facility]   BREAST CYST EXCISION Right late 90s   axilla area- fibroadenoma   BREAST CYST EXCISION Left 2015   same as right   BREAST EXCISIONAL BIOPSY Bilateral    fibroadenomas per pt- years ago   KNEE ARTHROSCOPY Right    RADIOACTIVE SEED GUIDED EXCISIONAL BREAST BIOPSY Right 04/03/2023   Procedure: RADIOACTIVE SEED GUIDED EXCISIONAL RIGHT BREAST BIOPSY;  Surgeon: Ebbie Cough, MD;  Location: [facility];  Service: General;  Laterality: Right;   TONSILLECTOMY      Home Medications:  Allergies as of 06/16/2024       Reactions   Minocycline Nausea And Vomiting   Iodine Rash        Medication List        Accurate as of June 16, 2024  4:14 PM. If you have any questions, ask your nurse or doctor.          amLODipine 2.5 MG tablet Commonly known as: NORVASC Take 1 tablet by mouth  daily.   clobetasol  cream 0.05 % Commonly known as: TEMOVATE  Apply to itchy bites on skin twice a day for 2 weeks, Avoid applying to face, groin, and axilla. Use as directed. Long-term use can cause thinning of the skin.   estradiol  2 MG tablet Commonly known as: ESTRACE  Take 2 mg by mouth daily.   mupirocin  ointment 2 % Commonly known as: BACTROBAN  Apply to scabs or infected areas on skin 2-3 times a day   pantoprazole 40 MG tablet Commonly known as: PROTONIX Take 1 tablet by mouth daily.   pilocarpine 5 MG tablet Commonly known as: SALAGEN Take 5 mg by  mouth 3 (three) times daily.   progesterone 100 MG capsule Commonly known as: PROMETRIUM Take 100 mg by mouth at bedtime.   tolterodine 4 MG 24 hr capsule Commonly known as: DETROL LA Take 4 mg by mouth daily.   traMADol  50 MG tablet Commonly known as: ULTRAM  Take 1 tablet (50 mg total) by mouth every 6 (six) hours as needed.   valACYclovir  500 MG tablet Commonly known as: VALTREX  Take one tab po BID x 7 days. Start one day prior to procedure.        Allergies:  Allergies  Allergen Reactions   Minocycline Nausea And Vomiting   Iodine Rash    Family History: Family History  Problem Relation Age of Onset   Cancer Mother    Bladder Cancer Neg Hx    Kidney cancer Neg Hx    Prostate cancer Neg Hx    Breast cancer Neg Hx     Social History:   reports that she has never smoked. She has never used smokeless tobacco. She reports that she does not currently use drugs. She reports that she does not drink alcohol.  Physical Exam: BP 123/76   Ht 5' (1.524 m)   Wt 118 lb (53.5 kg)   BMI 23.05 kg/m   Constitutional:  Alert and oriented, no acute distress, nontoxic appearing HEENT: [place], AT Cardiovascular: No clubbing, cyanosis, or edema Respiratory: Normal respiratory effort, no increased work of breathing Skin: No rashes, bruises or suspicious lesions Neurologic: Grossly intact, no focal deficits, moving all 4 extremities Psychiatric: Normal mood and affect  Laboratory Data: Results for orders placed or performed in visit on 06/16/24  BLADDER SCAN AMB NON-IMAGING   Collection Time: 06/16/24  4:17 PM  Result Value Ref Range   Scan Result >52ml    Assessment & Plan:   1. Frequent UTI (Primary) Quarterly episodes of irritative/storage related voiding symptoms that resolved with antibiotics.  Her UA's do tend to look grossly positive, her urine cultures have not been particularly impressive.  I would really like to see her the next time her symptoms occur so we can get an  atypical culture on her.  I asked her to call or reach out to me via MyChart to arrange a work in visit when this happens.  In the meantime, we will start topical vaginal estrogen cream as below. - BLADDER SCAN AMB NON-IMAGING  2. OAB (overactive bladder) Antimuscarinics are contraindicated in the setting of CREST with baseline dry mouth/dry eye.  I think tolterodine could also be contributing to her weak stream and intermittency.  Will start Myrbetriq  50 mg as an alternative and see her back for symptom recheck in 3 months.  If mirabegron  is cost prohibitive, may consider third line therapies in the future. - mirabegron  ER (MYRBETRIQ ) 50 MG TB24 tablet; Take 1 tablet (50 mg total) by mouth  daily.  Dispense: 30 tablet; Refill: 11  3. Genitourinary syndrome of menopause She reports vaginal dryness.  Will start topical vaginal estrogen cream.  We discussed I expect this will play a role in UTI prevention and may improve her OAB as well.  We discussed where to apply it. - estradiol  (ESTRACE ) 0.1 MG/GM vaginal cream; Apply one pea-sized amount around the opening of the urethra daily for 2 weeks, then 3 times weekly moving forward.  Dispense: 42.5 g; Refill: 12   Return in about 3 months (around 09/16/2024) for Symptom recheck with PVR.  Lucie Hones, PA-C  [facility] [address], [address] [place], [place] [address] [phone]

## 2024-06-19 ENCOUNTER — Telehealth: Payer: Self-pay

## 2024-06-19 NOTE — Telephone Encounter (Addendum)
 PA sent to [facility] via covermymeds.  Key: [id number]

## 2024-06-29 ENCOUNTER — Telehealth: Payer: Self-pay

## 2024-06-29 DIAGNOSIS — N3281 Overactive bladder: Secondary | ICD-10-CM

## 2024-06-29 NOTE — Telephone Encounter (Signed)
 Pt stopped by clinic today requesting an update of her Myrbetriq  medication.   States she called last week and has not heard anything.   PA completed on 8/1.  Denied on 8/5.  Per denial pt needs to try and fail pelvic floor/bladder training,   Reviewed cover my meds with pt.........still too expensive.   Pt is requesting an alternative drug. OK to use good rx and will use a different pharmacy other than her preferred ([facility]) for cost savings.   Pt aware SV is in clinic today. Pls allow 24 hours for a respone.   Pls advise.

## 2024-07-07 MED ORDER — SOLIFENACIN SUCCINATE 5 MG PO TABS
5.0000 mg | ORAL_TABLET | Freq: Every day | ORAL | 11 refills | Status: DC
Start: 2024-07-07 — End: 2024-09-16

## 2024-07-07 NOTE — Telephone Encounter (Signed)
 With her history of CREST, I strongly prefer her to be on a beta 3 agonist. I'll put in a referral for pelvic PT today per insurance requirements. She may call [phone] to schedule. I'll send in Vesicare  as an alternative agent in the interim, but expect this will worsen her baseline dry mouth and dry eye.

## 2024-08-10 ENCOUNTER — Ambulatory Visit: Admitting: Dermatology

## 2024-08-11 ENCOUNTER — Ambulatory Visit (INDEPENDENT_AMBULATORY_CARE_PROVIDER_SITE_OTHER): Payer: Self-pay | Admitting: Dermatology

## 2024-08-11 ENCOUNTER — Encounter: Payer: Self-pay | Admitting: Dermatology

## 2024-08-11 DIAGNOSIS — L988 Other specified disorders of the skin and subcutaneous tissue: Secondary | ICD-10-CM

## 2024-08-11 NOTE — Patient Instructions (Signed)

## 2024-08-11 NOTE — Progress Notes (Signed)
   Follow-Up Visit   Subjective  Katelyn Dennis is a 68 y.o. female who presents for the following: Botox for facial elastosis  The following portions of the chart were reviewed this encounter and updated as appropriate: medications, allergies, medical history  Review of Systems:  No other skin or systemic complaints except as noted in HPI or Assessment and Plan.  Objective  Well appearing patient in no apparent distress; mood and affect are within normal limits.  A focused examination was performed of the face.  Relevant physical exam findings are noted in the Assessment and Plan            Assessment & Plan    Facial Elastosis Botox 28 units injected today to: - Frown complex 20 units - Upper lip x 4 units - Lower lip x 4 units  Location: See attached image  Informed consent: Discussed risks (infection, pain, bleeding, bruising, swelling, allergic reaction, paralysis of nearby muscles, eyelid droop, double vision, neck weakness, difficulty breathing, headache, undesirable cosmetic result, and need for additional treatment) and benefits of the procedure, as well as the alternatives.  Informed consent was obtained.  Preparation: The area was cleansed with alcohol.  Procedure Details:  Botox was injected into the dermis with a 30-gauge needle. Pressure applied to any bleeding. Ice packs offered for swelling.  Lot Number:  [id number] Expiration:  08/2026  Total Units Injected:  28  Plan: Tylenol  may be used for headache.  Allow 2 weeks before returning to clinic for additional dosing as needed. Patient will call for any problems.  Recommend SkinTyte a non-surgical laser skin treatment for wrinkles on lower cheeks lateral to oral commissure area  with Dr Jackquline  See photos    Return in about 3 months (around 11/10/2024) for Botox .  IFay Kirks, CMA, am acting as scribe for Alm Rhyme, MD .   Documentation: I have reviewed the above documentation for  accuracy and completeness, and I agree with the above.  Alm Rhyme, MD

## 2024-08-13 ENCOUNTER — Ambulatory Visit: Admitting: Dermatology

## 2024-08-17 ENCOUNTER — Other Ambulatory Visit (INDEPENDENT_AMBULATORY_CARE_PROVIDER_SITE_OTHER): Payer: Self-pay | Admitting: Nurse Practitioner

## 2024-08-17 DIAGNOSIS — I8393 Asymptomatic varicose veins of bilateral lower extremities: Secondary | ICD-10-CM

## 2024-08-19 ENCOUNTER — Ambulatory Visit: Payer: TRICARE For Life (TFL) | Admitting: Dermatology

## 2024-08-19 ENCOUNTER — Encounter (INDEPENDENT_AMBULATORY_CARE_PROVIDER_SITE_OTHER)

## 2024-08-19 ENCOUNTER — Other Ambulatory Visit: Payer: Self-pay | Admitting: Dermatology

## 2024-08-19 ENCOUNTER — Ambulatory Visit (INDEPENDENT_AMBULATORY_CARE_PROVIDER_SITE_OTHER): Admitting: Vascular Surgery

## 2024-08-19 DIAGNOSIS — Z1283 Encounter for screening for malignant neoplasm of skin: Secondary | ICD-10-CM | POA: Diagnosis not present

## 2024-08-19 DIAGNOSIS — W908XXA Exposure to other nonionizing radiation, initial encounter: Secondary | ICD-10-CM

## 2024-08-19 DIAGNOSIS — L820 Inflamed seborrheic keratosis: Secondary | ICD-10-CM

## 2024-08-19 DIAGNOSIS — D225 Melanocytic nevi of trunk: Secondary | ICD-10-CM

## 2024-08-19 DIAGNOSIS — L578 Other skin changes due to chronic exposure to nonionizing radiation: Secondary | ICD-10-CM

## 2024-08-19 DIAGNOSIS — L814 Other melanin hyperpigmentation: Secondary | ICD-10-CM

## 2024-08-19 DIAGNOSIS — D489 Neoplasm of uncertain behavior, unspecified: Secondary | ICD-10-CM

## 2024-08-19 DIAGNOSIS — D239 Other benign neoplasm of skin, unspecified: Secondary | ICD-10-CM

## 2024-08-19 DIAGNOSIS — L821 Other seborrheic keratosis: Secondary | ICD-10-CM

## 2024-08-19 DIAGNOSIS — Z86018 Personal history of other benign neoplasm: Secondary | ICD-10-CM

## 2024-08-19 DIAGNOSIS — D229 Melanocytic nevi, unspecified: Secondary | ICD-10-CM

## 2024-08-19 DIAGNOSIS — L738 Other specified follicular disorders: Secondary | ICD-10-CM

## 2024-08-19 HISTORY — DX: Other benign neoplasm of skin, unspecified: D23.9

## 2024-08-19 NOTE — Progress Notes (Unsigned)
 Follow-Up Visit   Subjective  Katelyn Dennis is a 68 y.o. female who presents for the following: Skin Cancer Screening and Full Body Skin Exam Spot on right side noticed that is a little raised  The patient presents for Total-Body Skin Exam (TBSE) for skin cancer screening and mole check. The patient has spots, moles and lesions to be evaluated, some may be new or changing and the patient may have concern these could be cancer.  Will follow up with Dr. Jackquline concerning SkinTyte treatment for wrinkles on lower cheeks latera to oral commissure area   The following portions of the chart were reviewed this encounter and updated as appropriate: medications, allergies, medical history  Review of Systems:  No other skin or systemic complaints except as noted in HPI or Assessment and Plan.  Objective  Well appearing patient in no apparent distress; mood and affect are within normal limits.  A full examination was performed including scalp, head, eyes, ears, nose, lips, neck, chest, axillae, abdomen, back, buttocks, bilateral upper extremities, bilateral lower extremities, hands, feet, fingers, toes, fingernails, and toenails. All findings within normal limits unless otherwise noted below.   Relevant physical exam findings are noted in the Assessment and Plan.  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  Call if any new or changing lesions are noted between office visits  SKIN CANCER SCREENING PERFORMED TODAY.  ACTINIC DAMAGE - Chronic condition, secondary to cumulative UV/sun exposure -  diffuse scaly erythematous macules with underlying dyspigmentation - Recommend daily broad spectrum sunscreen SPF 30+ to sun-exposed areas, reapply every 2 hours as needed.  - Staying in the shade or wearing long sleeves, sun glasses (UVA+UVB protection) and wide brim hats (4-inch brim around the entire circumference of the hat) are also recommended for sun protection.  - Call for new or changing lesions.  LENTIGINES, SEBORRHEIC KERATOSES, HEMANGIOMAS - Benign normal skin lesions - Benign-appearing - Call for any changes  MELANOCYTIC NEVI - Tan-brown and/or pink-flesh-colored symmetric macules and papules - Benign appearing on exam today - Observation - Call clinic for new or changing moles - Recommend daily use of broad spectrum spf 30+ sunscreen to sun-exposed areas.   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 Call for changes.  NEOPLASM OF UNCERTAIN BEHAVIOR left medial middle edge of scapula Epidermal / dermal shaving  Lesion diameter (cm):  0.6 Informed consent: discussed and consent obtained   Timeout: patient name, date of birth, surgical site, and procedure verified   Procedure prep:  Patient was prepped and draped in usual sterile fashion Prep type:  Isopropyl alcohol Anesthesia: the lesion was anesthetized in a standard fashion   Anesthetic:  1% lidocaine  w/ epinephrine  1-100,000 buffered w/ 8.4% NaHCO3 Instrument used: flexible razor blade   Hemostasis achieved with: pressure, aluminum chloride and electrodesiccation   Outcome: patient tolerated procedure well   Post-procedure details: sterile dressing applied and wound care instructions given   Dressing type: bandage and petrolatum    Specimen 1 - Surgical pathology Differential Diagnosis: nevus r/o dysplasia  Check Margins: yes Nevus r/o dysplasia  INFLAMED SEBORRHEIC KERATOSIS (2) right side infra axillary x 1, right  inframammary costal edge x 1 (2) Symptomatic, irritating, patient would like treated. Destruction of lesion - right side infra axillary x 1, right inframammary costal edge x 1 (2) Complexity: simple   Destruction method: cryotherapy   Informed consent: discussed and consent obtained   Timeout:  patient name, date of birth, surgical site, and procedure verified Lesion destroyed using liquid nitrogen: Yes   Region frozen until ice ball extended beyond lesion: Yes   Outcome: patient tolerated procedure well with no complications   Post-procedure details: wound care instructions given    Return in about 1 year (around 08/19/2025) for TBSE.  IEleanor Blush, CMA, am acting as scribe for Alm Rhyme, MD.   Documentation: I have reviewed the above documentation for accuracy and completeness, and I agree with the above.  Alm Rhyme, MD

## 2024-08-19 NOTE — Patient Instructions (Addendum)
 Biopsy Wound Care Instructions  Leave the original bandage on for 24 hours if possible.  If the bandage becomes soaked or soiled before that time, it is OK to remove it and examine the wound.  A small amount of post-operative bleeding is normal.  If excessive bleeding occurs, remove the bandage, place gauze over the site and apply continuous pressure (no peeking) over the area for 30 minutes. If this does not work, please call our clinic as soon as possible or page your doctor if it is after hours.   Once a day, cleanse the wound with soap and water. It is fine to shower. If a thick crust develops you may use a Q-tip dipped into dilute hydrogen peroxide (mix 1:1 with water) to dissolve it.  Hydrogen peroxide can slow the healing process, so use it only as needed.    After washing, apply petroleum jelly (Vaseline) or an antibiotic ointment if your doctor prescribed one for you, followed by a bandage.    For best healing, the wound should be covered with a layer of ointment at all times. If you are not able to keep the area covered with a bandage to hold the ointment in place, this may mean re-applying the ointment several times a day.  Continue this wound care until the wound has healed and is no longer open.   Itching and mild discomfort is normal during the healing process. However, if you develop pain or severe itching, please call our office.   If you have any discomfort, you can take Tylenol (acetaminophen) or ibuprofen as directed on the bottle. (Please do not take these if you have an allergy to them or cannot take them for another reason).  Some redness, tenderness and white or yellow material in the wound is normal healing.  If the area becomes very sore and red, or develops a thick yellow-green material (pus), it may be infected; please notify us .    If you have stitches, return to clinic as directed to have the stitches removed. You will continue wound care for 2-3 days after the stitches  are removed.   Wound healing continues for up to one year following surgery. It is not unusual to experience pain in the scar from time to time during the interval.  If the pain becomes severe or the scar thickens, you should notify the office.    A slight amount of redness in a scar is expected for the first six months.  After six months, the redness will fade and the scar will soften and fade.  The color difference becomes less noticeable with time.  If there are any problems, return for a post-op surgery check at your earliest convenience.  To improve the appearance of the scar, you can use silicone scar gel, cream, or sheets (such as Mederma or Serica) every night for up to one year. These are available over the counter (without a prescription).  Please call our office at [phone] for any questions or concerns.  Seborrheic Keratosis  What causes seborrheic keratoses? Seborrheic keratoses are harmless, common skin growths that first appear during adult life.  As time goes by, more growths appear.  Some people may develop a large number of them.  Seborrheic keratoses appear on both covered and uncovered body parts.  They are not caused by sunlight.  The tendency to develop seborrheic keratoses can be inherited.  They vary in color from skin-colored to gray, brown, or even black.  They can be either  smooth or have a rough, warty surface.   Seborrheic keratoses are superficial and look as if they were stuck on the skin.  Under the microscope this type of keratosis looks like layers upon layers of skin.  That is why at times the top layer may seem to fall off, but the rest of the growth remains and re-grows.    Treatment Seborrheic keratoses do not need to be treated, but can easily be removed in the office.  Seborrheic keratoses often cause symptoms when they rub on clothing or jewelry.  Lesions can be in the way of shaving.  If they become inflamed, they can cause itching, soreness, or  burning.  Removal of a seborrheic keratosis can be accomplished by freezing, burning, or surgery. If any spot bleeds, scabs, or grows rapidly, please return to have it checked, as these can be an indication of a skin cancer.   Cryotherapy Aftercare  Wash gently with soap and water everyday.   Apply Vaseline and Band-Aid daily until healed.     Melanoma ABCDEs  Melanoma is the most dangerous type of skin cancer, and is the leading cause of death from skin disease.  You are more likely to develop melanoma if you: Have light-colored skin, light-colored eyes, or red or blond hair Spend a lot of time in the sun Tan regularly, either outdoors or in a tanning bed Have had blistering sunburns, especially during childhood Have a close family member who has had a melanoma Have atypical moles or large birthmarks  Early detection of melanoma is key since treatment is typically straightforward and cure rates are extremely high if we catch it early.   The first sign of melanoma is often a change in a mole or a new dark spot.  The ABCDE system is a way of remembering the signs of melanoma.  A for asymmetry:  The two halves do not match. B for border:  The edges of the growth are irregular. C for color:  A mixture of colors are present instead of an even brown color. D for diameter:  Melanomas are usually (but not always) greater than 6mm - the size of a pencil eraser. E for evolution:  The spot keeps changing in size, shape, and color.  Please check your skin once per month between visits. You can use a small mirror in front and a large mirror behind you to keep an eye on the back side or your body.   If you see any new or changing lesions before your next follow-up, please call to schedule a visit.  Please continue daily skin protection including broad spectrum sunscreen SPF 30+ to sun-exposed areas, reapplying every 2 hours as needed when you're outdoors.   Staying in the shade or wearing  long sleeves, sun glasses (UVA+UVB protection) and wide brim hats (4-inch brim around the entire circumference of the hat) are also recommended for sun protection.    Due to recent changes in healthcare laws, you may see results of your pathology and/or laboratory studies on MyChart before the doctors have had a chance to review them. We understand that in some cases there may be results that are confusing or concerning to you. Please understand that not all results are received at the same time and often the doctors may need to interpret multiple results in order to provide you with the best plan of care or course of treatment. Therefore, we ask that you please give us  2 business days to thoroughly review  all your results before contacting the office for clarification. Should we see a critical lab result, you will be contacted sooner.   If You Need Anything After Your Visit  If you have any questions or concerns for your doctor, please call our main line at [phone] and press option 4 to reach your doctor's medical assistant. If no one answers, please leave a voicemail as directed and we will return your call as soon as possible. Messages left after 4 pm will be answered the following business day.   You may also send us  a message via MyChart. We typically respond to MyChart messages within 1-2 business days.  For prescription refills, please ask your pharmacy to contact our office. Our fax number is [phone].  If you have an urgent issue when the clinic is closed that cannot wait until the next business day, you can page your doctor at the number below.    Please note that while we do our best to be available for urgent issues outside of office hours, we are not available 24/7.   If you have an urgent issue and are unable to reach us , you may choose to seek medical care at your doctor's office, retail clinic, urgent care center, or emergency room.  If you have a medical emergency, please  immediately call 911 or go to the emergency department.  Pager Numbers  - Dr. Hester: [phone]  - Dr. Jackquline: [phone]  - Dr. Claudene: [phone]   - Dr. Raymund: [phone]  In the event of inclement weather, please call our main line at [phone] for an update on the status of any delays or closures.  Dermatology Medication Tips: Please keep the boxes that topical medications come in in order to help keep track of the instructions about where and how to use these. Pharmacies typically print the medication instructions only on the boxes and not directly on the medication tubes.   If your medication is too expensive, please contact our office at [phone] option 4 or send us  a message through MyChart.   We are unable to tell what your co-pay for medications will be in advance as this is different depending on your insurance coverage. However, we may be able to find a substitute medication at lower cost or fill out paperwork to get insurance to cover a needed medication.   If a prior authorization is required to get your medication covered by your insurance company, please allow us  1-2 business days to complete this process.  Drug prices often vary depending on where the prescription is filled and some pharmacies may offer cheaper prices.  The website www.goodrx.com contains coupons for medications through different pharmacies. The prices here do not account for what the cost may be with help from insurance (it may be cheaper with your insurance), but the website can give you the price if you did not use any insurance.  - You can print the associated coupon and take it with your prescription to the pharmacy.  - You may also stop by our office during regular business hours and pick up a GoodRx coupon card.  - If you need your prescription sent electronically to a different pharmacy, notify our office through [facility] or by phone at [phone] option  4.     Si Usted Necesita Algo Despus de Su Visita  Tambin puede enviarnos un mensaje a travs de Clinical cytogeneticist. Por lo general respondemos a los mensajes de MyChart en el transcurso de 1 a 2  das hbiles.  Para renovar recetas, por favor pida a su farmacia que se ponga en contacto con nuestra oficina. Randi lakes de fax es [place] [phone].  Si tiene un asunto urgente cuando la clnica est cerrada y que no puede esperar hasta el siguiente da hbil, puede llamar/localizar a su doctor(a) al nmero que aparece a continuacin.   Por favor, tenga en cuenta que aunque hacemos todo lo posible para estar disponibles para asuntos urgentes fuera del horario de [place], no estamos disponibles las 24 horas del da, los 7 [address]  [address] de la [place].   Si tiene un problema urgente y no puede comunicarse con nosotros, puede optar por buscar atencin mdica  en el consultorio de su doctor(a), en una clnica privada, en un centro de atencin urgente o en una sala de emergencias.  Si tiene Engineer, drilling, por favor llame inmediatamente al 911 o vaya a la sala de emergencias.  Nmeros de bper  - Dr. Hester: [phone]  - Dra. Jackquline: [phone]  - Dr. Claudene: [phone]  - Dra. Kitts: [phone]  En caso de inclemencias del [employer], por favor llame a nuestra lnea principal al [phone] para una actualizacin sobre el estado de cualquier retraso o cierre.  Consejos para la medicacin en dermatologa: Por favor, guarde las cajas en las que vienen los medicamentos de uso tpico para ayudarle a seguir las instrucciones sobre dnde y cmo usarlos. Las farmacias generalmente imprimen las instrucciones del medicamento slo en las cajas y no directamente en los tubos del [place].   Si su medicamento es muy caro, por favor, pngase en contacto con landry rieger llamando al [phone] y presione la opcin 4 o envenos un mensaje a travs de Clinical cytogeneticist.   No podemos decirle cul ser su copago  por los medicamentos por adelantado ya que esto es diferente dependiendo de la cobertura de su seguro. Sin embargo, es posible que podamos encontrar un medicamento sustituto a Audiological scientist un formulario para que el seguro cubra el medicamento que se considera necesario.   Si se requiere una autorizacin previa para que su compaa de seguros malta su medicamento, por favor permtanos de 1 a 2 das hbiles para completar este proceso.  Los precios de los medicamentos varan con frecuencia dependiendo del Environmental consultant de dnde se surte la receta y alguna farmacias pueden ofrecer precios ms baratos.  El sitio web www.goodrx.com tiene cupones para medicamentos de Health and safety inspector. Los precios aqu no tienen en cuenta lo que podra costar con la ayuda del seguro (puede ser ms barato con su seguro), pero el sitio web puede darle el precio si no utiliz Tourist information centre manager.  - Puede imprimir el cupn correspondiente y llevarlo con su receta a la farmacia.  - Tambin puede pasar por nuestra oficina durante el horario de atencin regular y Education officer, museum una tarjeta de cupones de GoodRx.  - Si necesita que su receta se enve electrnicamente a una farmacia diferente, informe a nuestra oficina a travs de MyChart de [place] o por telfono llamando al [phone] y presione la opcin 4.

## 2024-08-20 ENCOUNTER — Encounter: Payer: Self-pay | Admitting: Dermatology

## 2024-08-20 ENCOUNTER — Ambulatory Visit: Payer: Self-pay | Admitting: Dermatology

## 2024-08-20 LAB — DERMATOLOGY PATHOLOGY

## 2024-08-24 ENCOUNTER — Telehealth: Payer: Self-pay

## 2024-08-24 ENCOUNTER — Encounter: Payer: Self-pay | Admitting: Dermatology

## 2024-08-24 NOTE — Telephone Encounter (Addendum)
 Called and discussed bx results with patient. She verbalized understanding and denied further questions. Will recheck at next follow up.  ---- Message from Alm Rhyme sent at 08/20/2024  5:06 PM EDT ----- FINAL DIAGNOSIS        1. Skin, left medial middle edge of scapula :       DYSPLASTIC JUNCTIONAL NEVUS WITH MODERATE ATYPIA, PERIPHERAL MARGIN INVOLVED   Moderate Dysplastic Recheck next visit ----- Message ----- From: Interface, Lab In [facility] [place] [facility] Sent: 08/20/2024   5:02 PM EDT To: Alm JAYSON Rhyme, MD

## 2024-08-24 NOTE — Telephone Encounter (Signed)
 Discussed scheduling patient for [facility] and it can take several treatments for best results.  Discussed fee $250 for just the cheek area.  Scheduled pt for 12/15/23 at 2:30./sh

## 2024-08-24 NOTE — Telephone Encounter (Signed)
-----   Message from Rexene Rattler sent at 08/24/2024 12:48 PM EDT ----- Yes I can do this.  My Mas will get her scheduled. ----- Message ----- From: Hester Alm BROCKS, MD Sent: 08/20/2024   5:10 PM EDT To: Alm BROCKS Hester, MD; Rexene Rattler, MD  This is the patient that would do well with skin tight if you are interested in doing her.  We have photos in media.  The area she is interested is the cheeks lateral to the oral commissure area where she has got some rhytides. Please contact her and discuss if urine interested in scheduling her.  Thanks

## 2024-09-16 ENCOUNTER — Ambulatory Visit (INDEPENDENT_AMBULATORY_CARE_PROVIDER_SITE_OTHER): Admitting: Physician Assistant

## 2024-09-16 ENCOUNTER — Ambulatory Visit: Admitting: Physician Assistant

## 2024-09-16 VITALS — BP 113/63 | HR 81 | Ht 61.0 in | Wt 116.0 lb

## 2024-09-16 DIAGNOSIS — N390 Urinary tract infection, site not specified: Secondary | ICD-10-CM | POA: Diagnosis not present

## 2024-09-16 DIAGNOSIS — N3281 Overactive bladder: Secondary | ICD-10-CM

## 2024-09-16 LAB — URINALYSIS, COMPLETE
Bilirubin, UA: NEGATIVE
Glucose, UA: NEGATIVE
Nitrite, UA: NEGATIVE
Specific Gravity, UA: 1.025 (ref 1.005–1.030)
Urobilinogen, Ur: 0.2 mg/dL (ref 0.2–1.0)
pH, UA: 6 (ref 5.0–7.5)

## 2024-09-16 LAB — BLADDER SCAN AMB NON-IMAGING: Scan Result: 33

## 2024-09-16 LAB — MICROSCOPIC EXAMINATION: WBC, UA: >30 /HPF — AB (ref 0–5)

## 2024-09-16 MED ORDER — CEFUROXIME AXETIL 250 MG PO TABS: 250.0000 mg | ORAL_TABLET | Freq: Two times a day (BID) | ORAL | 0 refills | Status: AC

## 2024-09-16 MED ORDER — SOLIFENACIN SUCCINATE 10 MG PO TABS: 10.0000 mg | ORAL_TABLET | Freq: Every day | ORAL | 11 refills | Status: AC

## 2024-09-16 NOTE — Patient Instructions (Signed)
 Pelvic floor PT scheduling: [phone]

## 2024-09-16 NOTE — Progress Notes (Signed)
 09/16/2024 10:31 AM   Katelyn Dennis 1956-06-07 [id number]  CC: Chief Complaint  Patient presents with   Over Active Bladder   HPI: Katelyn Dennis is a 68 y.o. female with PMH CREST, OAB on Vesicare , GSM on estrogen cream, and occasional UTI who presents today for follow-up.   At her last visit with me I attempted to switch her to a beta 3 agonist given her chronic dry mouth and dry eye due to CREST, however her insurance denied this until she fails pelvic floor PT.  A referral has been placed, though she has not been able to be seen yet.  Today she reports 10 days of dysuria, urgency, frequency and an isolated episode of emesis occurring 5 days ago.  She denies fever.  She wonders if her dose of Vesicare  could be increased, because she still having bothersome frequency, especially overnight.  In-office UA today positive for trace protein, 2+ blood, trace ketones, and 2+ leukocytes; urine microscopy with >30 WBCs/HPF, 11-30 RBCs/HPF, and moderate bacteria. PVR 33mL.  PMH: Past Medical History:  Diagnosis Date   Abnormal barium swallow 06/20/2021   Arthritis    CREST (calcinosis, Raynaud's phenomenon, esophageal dysfunction, sclerodactyly, telangiectasia) (HCC) 02/09/2019   Dysplastic nevus 08/19/2024   Left medial middle edge of scapula - moderate  - recheck at next follow up   Esophageal dysphagia 06/20/2021   Gastroesophageal reflux disease without esophagitis 02/09/2019   Hiatal hernia 06/20/2021   History of colonic polyps 06/20/2021   Hx of dysplastic nevus 06/23/2020   R epigastric moderate to severe, shave removal 08/04/20   Schatzki's ring 06/20/2021    Surgical History: Past Surgical History:  Procedure Laterality Date   BREAST BIOPSY Left 11/01/2022   US  Bx, Ribbon Clip, path pending   BREAST BIOPSY Left 11/01/2022   US  Bx, Heart Clip, path pending   BREAST BIOPSY Left 11/01/2022   US  LT BREAST BX W LOC DEV EA ADD LESION IMG BX SPEC US  GUIDE 11/01/2022  [facility]   BREAST BIOPSY Left 11/01/2022   US  LT BREAST BX W LOC DEV 1ST LESION IMG BX SPEC US  GUIDE 11/01/2022 [facility]   BREAST BIOPSY Left 01/15/2023   US  Bx, Coil Clip, path pending   BREAST BIOPSY Right 01/15/2023   US  RT BREAST BX W LOC DEV 1ST LESION IMG BX SPEC US  GUIDE 01/15/2023 [facility]   BREAST BIOPSY  04/02/2023   MM RT RADIOACTIVE SEED LOC MAMMO GUIDE 04/02/2023 [facility]   BREAST CYST EXCISION Right late 90s   axilla area- fibroadenoma   BREAST CYST EXCISION Left 2015   same as right   BREAST EXCISIONAL BIOPSY Bilateral    fibroadenomas per pt- years ago   KNEE ARTHROSCOPY Right    RADIOACTIVE SEED GUIDED EXCISIONAL BREAST BIOPSY Right 04/03/2023   Procedure: RADIOACTIVE SEED GUIDED EXCISIONAL RIGHT BREAST BIOPSY;  Surgeon: Ebbie Cough, MD;  Location: [facility];  Service: General;  Laterality: Right;   TONSILLECTOMY      Home Medications:  Allergies as of 09/16/2024       Reactions   Minocycline Nausea And Vomiting   Iodine Rash        Medication List        Accurate as of September 16, 2024 10:31 AM. If you have any questions, ask your nurse or doctor.          amLODipine 2.5 MG tablet Commonly known as: NORVASC Take 1 tablet by mouth daily.   clobetasol  cream 0.05 % Commonly  known as: TEMOVATE  Apply to itchy bites on skin twice a day for 2 weeks, Avoid applying to face, groin, and axilla. Use as directed. Long-term use can cause thinning of the skin.   estradiol  0.1 MG/GM vaginal cream Commonly known as: ESTRACE  Apply one pea-sized amount around the opening of the urethra daily for 2 weeks, then 3 times weekly moving forward.   estradiol  2 MG tablet Commonly known as: ESTRACE  Take 2 mg by mouth daily.   mupirocin  ointment 2 % Commonly known as: BACTROBAN  Apply to scabs or infected areas on skin 2-3 times a day   pantoprazole 40 MG tablet Commonly known as: PROTONIX Take 1 tablet by mouth  daily.   pilocarpine 5 MG tablet Commonly known as: SALAGEN Take 5 mg by mouth 3 (three) times daily.   progesterone 100 MG capsule Commonly known as: PROMETRIUM Take 100 mg by mouth at bedtime.   solifenacin  5 MG tablet Commonly known as: VESICARE  Take 1 tablet (5 mg total) by mouth daily.   traMADol  50 MG tablet Commonly known as: ULTRAM  Take 1 tablet (50 mg total) by mouth every 6 (six) hours as needed.   valACYclovir  500 MG tablet Commonly known as: VALTREX  Take one tab po BID x 7 days. Start one day prior to procedure.        Allergies:  Allergies  Allergen Reactions   Minocycline Nausea And Vomiting   Iodine Rash    Family History: Family History  Problem Relation Age of Onset   Cancer Mother    Bladder Cancer Neg Hx    Kidney cancer Neg Hx    Prostate cancer Neg Hx    Breast cancer Neg Hx     Social History:   reports that she has never smoked. She has never used smokeless tobacco. She reports that she does not currently use drugs. She reports that she does not drink alcohol.  Physical Exam: BP 113/63   Pulse 81   Ht 5' 1 (1.549 m)   Wt 116 lb (52.6 kg)   BMI 21.92 kg/m   Constitutional:  Alert and oriented, no acute distress, nontoxic appearing HEENT: [place], AT Cardiovascular: No clubbing, cyanosis, or edema Respiratory: Normal respiratory effort, no increased work of breathing Skin: No rashes, bruises or suspicious lesions Neurologic: Grossly intact, no focal deficits, moving all 4 extremities Psychiatric: Normal mood and affect  Laboratory Data: Results for orders placed or performed in visit on 09/16/24  Bladder Scan (Post Void Residual) in office   Collection Time: 09/16/24 10:07 AM  Result Value Ref Range   Scan Result 33    Assessment & Plan:   1. OAB (overactive bladder) (Primary) Emptying appropriately on Vesicare  5 mg.  Still having bothersome frequency.  Will increase to 10 mg.  I am also giving her the phone number for pelvic  floor PT scheduling today. - Bladder Scan (Post Void Residual) in office - solifenacin  (VESICARE ) 10 MG tablet; Take 1 tablet (10 mg total) by mouth daily.  Dispense: 30 tablet; Refill: 11  2. Frequent UTI UA appears grossly infected, will start empiric cefuroxime and send for standard and atypical cultures. - Urinalysis, Complete - CULTURE, URINE COMPREHENSIVE - Mycoplasma / ureaplasma culture - cefUROXime (CEFTIN) 250 MG tablet; Take 1 tablet (250 mg total) by mouth 2 (two) times daily with a meal for 5 days.  Dispense: 10 tablet; Refill: 0   Return in about 6 months (around 03/17/2025) for OAB follow up with PVR.  Emori Mumme, PA-C  [facility] [address] [place], [place] [address] [phone]

## 2024-09-19 LAB — CULTURE, URINE COMPREHENSIVE

## 2024-09-23 LAB — MYCOPLASMA / UREAPLASMA CULTURE

## 2024-10-07 ENCOUNTER — Encounter (INDEPENDENT_AMBULATORY_CARE_PROVIDER_SITE_OTHER): Payer: Self-pay | Admitting: Vascular Surgery

## 2024-10-07 ENCOUNTER — Ambulatory Visit (INDEPENDENT_AMBULATORY_CARE_PROVIDER_SITE_OTHER): Admitting: Vascular Surgery

## 2024-10-07 ENCOUNTER — Other Ambulatory Visit (INDEPENDENT_AMBULATORY_CARE_PROVIDER_SITE_OTHER)

## 2024-10-07 VITALS — BP 121/76 | HR 66 | Resp 18 | Ht 60.5 in | Wt 118.0 lb

## 2024-10-07 DIAGNOSIS — M341 CR(E)ST syndrome: Secondary | ICD-10-CM | POA: Diagnosis not present

## 2024-10-07 DIAGNOSIS — I8393 Asymptomatic varicose veins of bilateral lower extremities: Secondary | ICD-10-CM

## 2024-10-07 DIAGNOSIS — I890 Lymphedema, not elsewhere classified: Secondary | ICD-10-CM | POA: Diagnosis not present

## 2024-10-07 NOTE — Progress Notes (Signed)
 Subjective:    Patient ID: Katelyn Dennis, female    DOB: [birth year]atelyn Dennis 21, 1957, 68 y.o.   MRN: [id number] Chief Complaint  Patient presents with   Follow-up    3 months + reflux Baker cyst per patient left knee patient wants some veins removed     Katelyn Dennis is a 68 yo female who presents to clinic today in follow up for bilateral lower extremity edema with painful varicose veins.  Patient endorses she has been using conservative therapy such as compression moderately but not all the time.  She has had some success as her legs do look better today but she is continuing to have painful varicose veins to her right lower extremity.  There are 3 areas of her concern which she does on the front of her right knee, behind her right knee and her medial right thigh.  These 3 areas are painful and make it very difficult for her to wear any type of compression.  She would like to address this with sclerotherapy as soon as possible.  She did however undergo bilateral lower extremity venous Doppler ultrasounds today.  She does have reflux in her right greater saphenous vein in both the proximal thigh and mid thigh.  She also has some reflux in her right short saphenous vein at the [facility].  She is also noted to have reflux in her left greater saphenous vein in the midportion of her thigh and the distal part of her thigh.  She does have varicose veins that are painful in her left leg as well but they are not as prominent as her right leg.    Review of Systems  Constitutional: Negative.   Cardiovascular:  Positive for leg swelling.  Musculoskeletal:  Positive for myalgias.  Skin:  Positive for color change.       Positive varicose veins on both bilateral lower extremities.  Reports them as being painful  All other systems reviewed and are negative.      Objective:   Physical Exam Vitals reviewed.  Constitutional:      Appearance: Normal appearance. She is normal weight.  HENT:     Head: Normocephalic.  Eyes:      Pupils: Pupils are equal, round, and reactive to light.  Cardiovascular:     Rate and Rhythm: Normal rate and regular rhythm.     Pulses: Normal pulses.     Heart sounds: Normal heart sounds.  Pulmonary:     Effort: Pulmonary effort is normal.     Breath sounds: Normal breath sounds.  Abdominal:     General: Abdomen is flat. Bowel sounds are normal.     Palpations: Abdomen is soft.  Musculoskeletal:        General: Tenderness present. Normal range of motion.     Comments: Positive tenderness to 3 areas of painful varicose veins.  Right knee anterior, right knee posterior and right medial thigh.  Skin:    General: Skin is warm and dry.     Capillary Refill: Capillary refill takes 2 to 3 seconds.  Neurological:     General: No focal deficit present.     Mental Status: She is alert and oriented to person, place, and time. Mental status is at baseline.  Psychiatric:        Mood and Affect: Mood normal.        Behavior: Behavior normal.        Thought Content: Thought content normal.        Judgment: Judgment  normal.     BP 121/76   Pulse 66   Resp 18   Ht 5' 0.5 (1.537 m)   Wt 118 lb (53.5 kg)   BMI 22.67 kg/m   Past Medical History:  Diagnosis Date   Abnormal barium swallow 06/20/2021   Arthritis    CREST (calcinosis, Raynaud's phenomenon, esophageal dysfunction, sclerodactyly, telangiectasia) (HCC) 02/09/2019   Dysplastic nevus 08/19/2024   Left medial middle edge of scapula - moderate  - recheck at next follow up   Esophageal dysphagia 06/20/2021   Gastroesophageal reflux disease without esophagitis 02/09/2019   Hiatal hernia 06/20/2021   History of colonic polyps 06/20/2021   Hx of dysplastic nevus 06/23/2020   R epigastric moderate to severe, shave removal 08/04/20   Schatzki's ring 06/20/2021    Social History   Socioeconomic History   Marital status: Married    Spouse name: Not on file   Number of children: Not on file   Years of education: Not on file    Highest education level: Not on file  Occupational History   Not on file  Tobacco Use   Smoking status: Never   Smokeless tobacco: Never  Substance and Sexual Activity   Alcohol use: Never   Drug use: Not Currently   Sexual activity: Not on file  Other Topics Concern   Not on file  Social History Narrative   Not on file   Social Drivers of Health   Financial Resource Strain: Patient Declined (02/19/2024)   Received from [facility]   Overall Financial Resource Strain (CARDIA)    Difficulty of Paying Living Expenses: Patient declined  Food Insecurity: Patient Declined (02/19/2024)   Received from [facility]   Hunger Vital Sign    Within the past 12 months, you worried that your food would run out before you got the money to buy more.: Patient declined    Within the past 12 months, the food you bought just didn't last and you didn't have money to get more.: Patient declined  Transportation Needs: Patient Declined (02/19/2024)   Received from [facility]   PRAPARE - Transportation    In the past 12 months, has lack of transportation kept you from medical appointments or from getting medications?: Patient declined    Lack of Transportation (Non-Medical): Patient declined  Physical Activity: Not on file  Stress: Not on file  Social Connections: Not on file  Intimate Partner Violence: Not on file    Past Surgical History:  Procedure Laterality Date   BREAST BIOPSY Left 11/01/2022   US  Bx, Ribbon Clip, path pending   BREAST BIOPSY Left 11/01/2022   US  Bx, Heart Clip, path pending   BREAST BIOPSY Left 11/01/2022   US  LT BREAST BX W LOC DEV EA ADD LESION IMG BX SPEC US  GUIDE 11/01/2022 [facility]   BREAST BIOPSY Left 11/01/2022   US  LT BREAST BX W LOC DEV 1ST LESION IMG BX SPEC US  GUIDE 11/01/2022 [facility]   BREAST BIOPSY Left 01/15/2023   US  Bx, Coil Clip, path pending   BREAST BIOPSY Right 01/15/2023   US  RT  BREAST BX W LOC DEV 1ST LESION IMG BX SPEC US  GUIDE 01/15/2023 [facility]   BREAST BIOPSY  04/02/2023   MM RT RADIOACTIVE SEED LOC MAMMO GUIDE 04/02/2023 [facility]   BREAST CYST EXCISION Right late 90s   axilla area- fibroadenoma   BREAST CYST EXCISION Left 2015   same as right   BREAST EXCISIONAL BIOPSY  Bilateral    fibroadenomas per pt- years ago   KNEE ARTHROSCOPY Right    RADIOACTIVE SEED GUIDED EXCISIONAL BREAST BIOPSY Right 04/03/2023   Procedure: RADIOACTIVE SEED GUIDED EXCISIONAL RIGHT BREAST BIOPSY;  Surgeon: Ebbie Cough, MD;  Location: [facility];  Service: General;  Laterality: Right;   TONSILLECTOMY      Family History  Problem Relation Age of Onset   Cancer Mother    Bladder Cancer Neg Hx    Kidney cancer Neg Hx    Prostate cancer Neg Hx    Breast cancer Neg Hx     Allergies  Allergen Reactions   Minocycline Nausea And Vomiting   Iodine Rash        No data to display            CMP  No results found for: NA, K, CL, CO2, GLUCOSE, BUN, CREATININE, CALCIUM, PROT, ALBUMIN, AST, ALT, ALKPHOS, BILITOT, GFR, EGFR, GFRNONAA   No results found.     Assessment & Plan:   1. Lymphedema (Primary) Patient returns to clinic today in follow-up for bilateral lower extremity edema with painful varicose veins.  Patient reports she has been using conventional therapies such as compression, rest, elevation and exercise very sparingly if not moderately.  She has had some success but she still remains with painful varicose veins to her right lower extremity greater than her left.  Patient did undergo bilateral lower extremity venous Doppler ultrasounds to rule out DVT and reflux.  She has no DVT noted on the study today however she does have positive reflux in bilateral thighs to the greater saphenous vein in both the mid portions.  We discussed in detail today sclerotherapy to these areas.  She would like  to proceed with that as soon as possible.  Orvin Daring nurse practitioner in our office here does sclerotherapy with saline and took a quick look at the patient's right lower extremity.  She agrees and we will get her set up after authorization for sclerotherapy.  2. CREST (calcinosis, Raynaud's phenomenon, esophageal dysfunction, sclerodactyly, telangiectasia) (HCC) Continue antihypertensive medications as already ordered to help treat your CREST, these medications have been reviewed and there are no changes at this time.    Current Outpatient Medications on File Prior to Visit  Medication Sig Dispense Refill   amLODipine (NORVASC) 2.5 MG tablet Take 1 tablet by mouth daily.     clobetasol  cream (TEMOVATE ) 0.05 % Apply to itchy bites on skin twice a day for 2 weeks, Avoid applying to face, groin, and axilla. Use as directed. Long-term use can cause thinning of the skin. 15 g 1   estradiol  (ESTRACE ) 0.1 MG/GM vaginal cream Apply one pea-sized amount around the opening of the urethra daily for 2 weeks, then 3 times weekly moving forward. 42.5 g 12   estradiol  (ESTRACE ) 2 MG tablet Take 2 mg by mouth daily.     mupirocin  ointment (BACTROBAN ) 2 % Apply to scabs or infected areas on skin 2-3 times a day 22 g 2   neomycin-polymyxin b-dexamethasone  (MAXITROL) 3.5-10000-0.1 OINT      neomycin-polymyxin b-dexamethasone  (MAXITROL) 3.5-10000-0.1 SUSP 1 drop 3 (three) times daily.     pantoprazole (PROTONIX) 40 MG tablet Take 1 tablet by mouth daily.     pilocarpine (SALAGEN) 5 MG tablet Take 5 mg by mouth 3 (three) times daily.     progesterone (PROMETRIUM) 100 MG capsule Take 100 mg by mouth at bedtime.     solifenacin  (VESICARE ) 10 MG  tablet Take 1 tablet (10 mg total) by mouth daily. 30 tablet 11   traMADol  (ULTRAM ) 50 MG tablet Take 1 tablet (50 mg total) by mouth every 6 (six) hours as needed. 10 tablet 0   valACYclovir  (VALTREX ) 500 MG tablet Take one tab po BID x 7 days. Start one day prior to  procedure. (Patient not taking: Reported on 10/07/2024) 14 tablet 0   No current facility-administered medications on file prior to visit.    There are no Patient Instructions on file for this visit. No follow-ups on file.   Gwendlyn JONELLE Shank, NP

## 2024-10-23 ENCOUNTER — Ambulatory Visit (INDEPENDENT_AMBULATORY_CARE_PROVIDER_SITE_OTHER): Admitting: Physician Assistant

## 2024-10-23 VITALS — BP 128/73 | HR 76 | Ht 60.0 in | Wt 115.0 lb

## 2024-10-23 DIAGNOSIS — N390 Urinary tract infection, site not specified: Secondary | ICD-10-CM | POA: Diagnosis not present

## 2024-10-23 MED ORDER — SULFAMETHOXAZOLE-TRIMETHOPRIM 800-160 MG PO TABS: 1.0000 | ORAL_TABLET | Freq: Two times a day (BID) | ORAL | 0 refills | Status: AC

## 2024-10-23 NOTE — Patient Instructions (Addendum)
 Recurrent UTI Prevention Strategies  Stay well hydrated. Start taking an over-the-counter cranberry supplement, with or without d-mannose, for urinary tract health. Take this once or twice daily on an empty stomach, e.g. right before bed. Start taking an over-the-counter probiotic containing the bacterial species called Lactobacillus. Take this daily. Continue vaginal estrogen cream three times weekly forever.

## 2024-10-23 NOTE — Progress Notes (Signed)
 10/23/2024 11:10 AM   Katelyn Dennis February 23, 1956 [id number]  CC: Chief Complaint  Patient presents with   Recurrent UTI   HPI: Katelyn Dennis is a 67 y.o. female with PMH CREST, OAB on Vesicare , GSM on estrogen cream, and occasional UTI who presents today for evaluation of possible UTI.   Today she reports 4 days of dysuria and frequency.  She is becoming very frustrated with her frequency of UTI and feels that her bladder is interfering with her life.  She takes a cranberry supplement once daily and estrogen cream 1-2 times weekly.  In-office UA today positive for 2+ blood, trace ketones, and 2+ leukocytes; urine microscopy with >30 WBCs/HPF, 3-10 RBCs/HPF, and moderate bacteria.   PMH: Past Medical History:  Diagnosis Date   Abnormal barium swallow 06/20/2021   Arthritis    CREST (calcinosis, Raynaud's phenomenon, esophageal dysfunction, sclerodactyly, telangiectasia) (HCC) 02/09/2019   Dysplastic nevus 08/19/2024   Left medial middle edge of scapula - moderate  - recheck at next follow up   Esophageal dysphagia 06/20/2021   Gastroesophageal reflux disease without esophagitis 02/09/2019   Hiatal hernia 06/20/2021   History of colonic polyps 06/20/2021   Hx of dysplastic nevus 06/23/2020   R epigastric moderate to severe, shave removal 08/04/20   Schatzki's ring 06/20/2021    Surgical History: Past Surgical History:  Procedure Laterality Date   BREAST BIOPSY Left 11/01/2022   US  Bx, Ribbon Clip, path pending   BREAST BIOPSY Left 11/01/2022   US  Bx, Heart Clip, path pending   BREAST BIOPSY Left 11/01/2022   US  LT BREAST BX W LOC DEV EA ADD LESION IMG BX SPEC US  GUIDE 11/01/2022 [facility]   BREAST BIOPSY Left 11/01/2022   US  LT BREAST BX W LOC DEV 1ST LESION IMG BX SPEC US  GUIDE 11/01/2022 [facility]   BREAST BIOPSY Left 01/15/2023   US  Bx, Coil Clip, path pending   BREAST BIOPSY Right 01/15/2023   US  RT BREAST BX W LOC DEV 1ST LESION IMG BX SPEC US  GUIDE  01/15/2023 [facility]   BREAST BIOPSY  04/02/2023   MM RT RADIOACTIVE SEED LOC MAMMO GUIDE 04/02/2023 [facility]   BREAST CYST EXCISION Right late 90s   axilla area- fibroadenoma   BREAST CYST EXCISION Left 2015   same as right   BREAST EXCISIONAL BIOPSY Bilateral    fibroadenomas per pt- years ago   KNEE ARTHROSCOPY Right    RADIOACTIVE SEED GUIDED EXCISIONAL BREAST BIOPSY Right 04/03/2023   Procedure: RADIOACTIVE SEED GUIDED EXCISIONAL RIGHT BREAST BIOPSY;  Surgeon: Ebbie Cough, MD;  Location: [facility];  Service: General;  Laterality: Right;   TONSILLECTOMY      Home Medications:  Allergies as of 10/23/2024       Reactions   Minocycline Nausea And Vomiting   Iodine Rash        Medication List        Accurate as of October 23, 2024 11:10 AM. If you have any questions, ask your nurse or doctor.          amLODipine 2.5 MG tablet Commonly known as: NORVASC Take 1 tablet by mouth daily.   clobetasol  cream 0.05 % Commonly known as: TEMOVATE  Apply to itchy bites on skin twice a day for 2 weeks, Avoid applying to face, groin, and axilla. Use as directed. Long-term use can cause thinning of the skin.   estradiol  0.1 MG/GM vaginal cream Commonly known as: ESTRACE  Apply one pea-sized amount around the opening of the urethra  daily for 2 weeks, then 3 times weekly moving forward.   estradiol  2 MG tablet Commonly known as: ESTRACE  Take 2 mg by mouth daily.   mupirocin  ointment 2 % Commonly known as: BACTROBAN  Apply to scabs or infected areas on skin 2-3 times a day   neomycin-polymyxin b-dexamethasone  3.5-10000-0.1 Oint Commonly known as: MAXITROL   neomycin-polymyxin b-dexamethasone  3.5-10000-0.1 Susp Commonly known as: MAXITROL 1 drop 3 (three) times daily.   pantoprazole 40 MG tablet Commonly known as: PROTONIX Take 1 tablet by mouth daily.   pilocarpine 5 MG tablet Commonly known as: SALAGEN Take 5 mg by mouth 3 (three)  times daily.   progesterone 100 MG capsule Commonly known as: PROMETRIUM Take 100 mg by mouth at bedtime.   solifenacin  10 MG tablet Commonly known as: VESICARE  Take 1 tablet (10 mg total) by mouth daily.   traMADol  50 MG tablet Commonly known as: ULTRAM  Take 1 tablet (50 mg total) by mouth every 6 (six) hours as needed.   valACYclovir  500 MG tablet Commonly known as: VALTREX  Take one tab po BID x 7 days. Start one day prior to procedure.        Allergies:  Allergies  Allergen Reactions   Minocycline Nausea And Vomiting   Iodine Rash    Family History: Family History  Problem Relation Age of Onset   Cancer Mother    Bladder Cancer Neg Hx    Kidney cancer Neg Hx    Prostate cancer Neg Hx    Breast cancer Neg Hx     Social History:   reports that she has never smoked. She has never used smokeless tobacco. She reports that she does not currently use drugs. She reports that she does not drink alcohol.  Physical Exam: BP 128/73 (BP Location: Left Arm, Patient Position: Sitting, Cuff Size: Normal)   Pulse 76   Ht 5' (1.524 m)   Wt 115 lb (52.2 kg)   SpO2 97%   BMI 22.46 kg/m   Constitutional:  Alert and oriented, no acute distress, nontoxic appearing HEENT: [place], AT Cardiovascular: No clubbing, cyanosis, or edema Respiratory: Normal respiratory effort, no increased work of breathing Skin: No rashes, bruises or suspicious lesions Neurologic: Grossly intact, no focal deficits, moving all 4 extremities Psychiatric: Normal mood and affect  Laboratory Data: See epic  Assessment & Plan:   1. Recurrent UTI (Primary) UA appears grossly positive, will start empiric Bactrim  and send for culture for further evaluation.  We discussed that she now meets criteria for recurrent UTI.  We discussed prevention strategies including cranberry with or without d-mannose, lactobacillus containing probiotics, and estrogen cream.  I offered her suppressive antibiotics and she would  like to pursue this.  I will send in a 12-month supply based on her culture results today.  She is in agreement. - Urinalysis, Complete - CULTURE, URINE COMPREHENSIVE - sulfamethoxazole -trimethoprim  (BACTRIM  DS) 800-160 MG tablet; Take 1 tablet by mouth 2 (two) times daily for 5 days.  Dispense: 10 tablet; Refill: 0   Return for Will call to start suppressive abx per cx results + 3 months rUTI follow up.  Lucie Hones, PA-C  [facility] [place] [address] [place], [place] [address] [phone]

## 2024-10-26 LAB — URINALYSIS, COMPLETE
Bilirubin, UA: NEGATIVE
Glucose, UA: NEGATIVE
Nitrite, UA: NEGATIVE
Protein,UA: NEGATIVE
Specific Gravity, UA: 1.025 (ref 1.005–1.030)
Urobilinogen, Ur: 0.2 mg/dL (ref 0.2–1.0)
pH, UA: 6 (ref 5.0–7.5)

## 2024-10-26 LAB — MICROSCOPIC EXAMINATION: WBC, UA: >30 /HPF — AB (ref 0–5)

## 2024-10-27 ENCOUNTER — Ambulatory Visit: Admitting: Physician Assistant

## 2024-10-27 LAB — CULTURE, URINE COMPREHENSIVE

## 2024-10-28 ENCOUNTER — Telehealth (INDEPENDENT_AMBULATORY_CARE_PROVIDER_SITE_OTHER): Payer: Self-pay

## 2024-10-28 ENCOUNTER — Telehealth: Payer: Self-pay

## 2024-10-28 DIAGNOSIS — N390 Urinary tract infection, site not specified: Secondary | ICD-10-CM

## 2024-10-28 MED ORDER — TRIMETHOPRIM 100 MG PO TABS: 100.0000 mg | ORAL_TABLET | Freq: Every day | ORAL | 0 refills | Status: DC

## 2024-10-28 NOTE — Telephone Encounter (Signed)
 I spoke with the patient via telephone and we discussed that her urine culture results were negative.  She is feeling somewhat better on Bactrim .   Given her increasingly frequent irritative voiding symptoms, now with a negative urine culture and microscopic hematuria, I recommended renal ultrasound and cystoscopy for further evaluation.  She agreed.  She is planning to go on a cruise this weekend.  I am sending in suppressive trimethoprim  for UTI prevention in light of her upcoming travel.  Joane, please schedule her for a cystoscopy with Dr. Twylla for after she returns from her trip.

## 2024-10-28 NOTE — Telephone Encounter (Signed)
 Called ptto schedule SALINE sclero with Delores Pickles NP. Pt states she is not home and will call back later to schedule with [facility].   Right leg SALINE sclero see fb. No prior auth required. 3 units

## 2024-10-28 NOTE — Telephone Encounter (Signed)
 Pt LM on triage line-   Returning call from [facility]. Discuss urine culture results.

## 2024-11-05 ENCOUNTER — Ambulatory Visit: Admission: RE | Admit: 2024-11-05 | Source: Ambulatory Visit

## 2024-11-05 DIAGNOSIS — N390 Urinary tract infection, site not specified: Secondary | ICD-10-CM | POA: Diagnosis present

## 2024-11-10 ENCOUNTER — Ambulatory Visit: Admitting: Dermatology

## 2024-11-10 ENCOUNTER — Encounter: Payer: Self-pay | Admitting: Dermatology

## 2024-11-10 DIAGNOSIS — L988 Other specified disorders of the skin and subcutaneous tissue: Secondary | ICD-10-CM

## 2024-11-10 NOTE — Patient Instructions (Signed)

## 2024-11-10 NOTE — Progress Notes (Signed)
" ° °  Follow-Up Visit   Subjective  Katelyn Dennis is a 68 y.o. female who presents for the following: Botox for facial elastosis  The following portions of the chart were reviewed this encounter and updated as appropriate: medications, allergies, medical history  Review of Systems:  No other skin or systemic complaints except as noted in HPI or Assessment and Plan.  Objective  Well appearing patient in no apparent distress; mood and affect are within normal limits.  A focused examination was performed of the face.  Relevant physical exam findings are noted in the Assessment and Plan.      Assessment & Plan    Facial Elastosis  Botox 28 units injected today to: - Frown complex 20 units - Upper lip x 4 units - Lower lip x 4 units  Location: See attached image  Informed consent: Discussed risks (infection, pain, bleeding, bruising, swelling, allergic reaction, paralysis of nearby muscles, eyelid droop, double vision, neck weakness, difficulty breathing, headache, undesirable cosmetic result, and need for additional treatment) and benefits of the procedure, as well as the alternatives.  Informed consent was obtained.  Preparation: The area was cleansed with alcohol.  Procedure Details:  Botox was injected into the dermis with a 30-gauge needle. Pressure applied to any bleeding. Ice packs offered for swelling.  Lot Number:  [id number] Expiration:  09/2026  Total Units Injected:  28  Plan: Tylenol  may be used for headache.  Allow 2 weeks before returning to clinic for additional dosing as needed. Patient will call for any problems.  Return in about 4 months (around 03/11/2025) for Botox injections.  [name], CMA, am acting as scribe for Alm Rhyme, MD .   Documentation: I have reviewed the above documentation for accuracy and completeness, and I agree with the above.  Alm Rhyme, MD      "

## 2024-11-17 ENCOUNTER — Encounter (INDEPENDENT_AMBULATORY_CARE_PROVIDER_SITE_OTHER): Payer: Self-pay | Admitting: Nurse Practitioner

## 2024-11-17 ENCOUNTER — Ambulatory Visit (INDEPENDENT_AMBULATORY_CARE_PROVIDER_SITE_OTHER): Admitting: Nurse Practitioner

## 2024-11-17 VITALS — BP 113/75 | HR 79 | Resp 18 | Ht 60.5 in | Wt 116.2 lb

## 2024-11-17 DIAGNOSIS — I83893 Varicose veins of bilateral lower extremities with other complications: Secondary | ICD-10-CM | POA: Diagnosis not present

## 2024-11-22 ENCOUNTER — Encounter (INDEPENDENT_AMBULATORY_CARE_PROVIDER_SITE_OTHER): Payer: Self-pay | Admitting: Nurse Practitioner

## 2024-11-22 NOTE — Progress Notes (Signed)
 Varicose veins of bilateral  lower extremity with inflammation (454.1  I83.10) Current Plans   Indication: Patient presents with symptomatic varicose veins of the bilateral  lower extremity.   Procedure: Sclerotherapy using hypertonic saline mixed with 1% Lidocaine was performed on the bilateral lower extremity. Compression wraps were placed. The patient tolerated the procedure well.

## 2024-11-23 ENCOUNTER — Encounter: Payer: Self-pay | Admitting: Urology

## 2024-11-23 ENCOUNTER — Ambulatory Visit (INDEPENDENT_AMBULATORY_CARE_PROVIDER_SITE_OTHER): Admitting: Urology

## 2024-11-23 VITALS — BP 131/67 | HR 70 | Ht 60.5 in | Wt 113.0 lb

## 2024-11-23 DIAGNOSIS — N390 Urinary tract infection, site not specified: Secondary | ICD-10-CM

## 2024-11-23 MED ORDER — TRIMETHOPRIM 100 MG PO TABS: 100.0000 mg | ORAL_TABLET | Freq: Every day | ORAL | 2 refills | Status: AC

## 2024-11-23 NOTE — Progress Notes (Signed)
" ° °  11/23/2024  CC:  Chief Complaint  Patient presents with   Cysto    HPI: Refer to Sam Vaillancourt's previous note 10/23/2024.  She has done well on low-dose antibiotic suppression.  Remains on low-dose vaginal estrogen.  Blood pressure 131/67, pulse 70, height 5' 0.5 (1.537 m), weight 113 lb (51.3 kg). NED. A&Ox3.     Cystoscopy Procedure Note  Patient identification was confirmed, informed consent was obtained, and patient was prepped using Betadine solution.  Lidocaine  jelly was administered per urethral meatus.    Procedure: - Flexible cystoscope introduced, without any difficulty.   - Thorough search of the bladder revealed:    normal urethral meatus    normal urothelium    no stones    no ulcers     no tumors    no urethral polyps    no trabeculation  - Ureteral orifices were normal in position and appearance.  Post-Procedure: - Patient tolerated the procedure well  Assessment/ Plan: Negative cystoscopy Continue low-dose antibiotic prophylaxis for 90 days PA follow-up ~2 weeks after discontinuing suppression    Glendia JAYSON Barba, MD "

## 2024-11-24 LAB — MICROSCOPIC EXAMINATION

## 2024-11-24 LAB — URINALYSIS, COMPLETE
Bilirubin, UA: NEGATIVE
Glucose, UA: NEGATIVE
Ketones, UA: NEGATIVE
Leukocytes,UA: NEGATIVE
Nitrite, UA: NEGATIVE
Protein,UA: NEGATIVE
Specific Gravity, UA: 1.015 (ref 1.005–1.030)
Urobilinogen, Ur: 0.2 mg/dL (ref 0.2–1.0)
pH, UA: 6 (ref 5.0–7.5)

## 2024-12-14 ENCOUNTER — Ambulatory Visit: Admitting: Dermatology

## 2024-12-23 ENCOUNTER — Telehealth (INDEPENDENT_AMBULATORY_CARE_PROVIDER_SITE_OTHER): Payer: Self-pay | Admitting: Nurse Practitioner

## 2024-12-23 NOTE — Telephone Encounter (Signed)
 Spoke to pt who states she was driving and would call back tmr to schedule with [facility]  Bilat SALINE sclero see fb. No auth required per Kevin D. Ref #[id number]. [phone]). On 12/23/24. # 4 units total

## 2024-12-30 ENCOUNTER — Ambulatory Visit: Admitting: Dermatology

## 2025-01-21 ENCOUNTER — Ambulatory Visit: Admitting: Physician Assistant

## 2025-01-27 ENCOUNTER — Ambulatory Visit: Admitting: Physician Assistant

## 2025-03-11 ENCOUNTER — Ambulatory Visit: Admitting: Dermatology

## 2025-03-17 ENCOUNTER — Ambulatory Visit: Admitting: Physician Assistant

## 2025-08-19 ENCOUNTER — Ambulatory Visit: Admitting: Dermatology
